# Patient Record
Sex: Male | Born: 1975 | Race: Black or African American | Hispanic: No | Marital: Single | Smoking: Current every day smoker
Health system: Southern US, Community
[De-identification: ages and names within clinical notes are randomized; demographics above are authoritative.]

## PROBLEM LIST (undated history)

## (undated) DIAGNOSIS — I10 Essential (primary) hypertension: Secondary | ICD-10-CM

## (undated) DIAGNOSIS — N433 Hydrocele, unspecified: Secondary | ICD-10-CM

## (undated) DIAGNOSIS — K219 Gastro-esophageal reflux disease without esophagitis: Secondary | ICD-10-CM

## (undated) DIAGNOSIS — R351 Nocturia: Secondary | ICD-10-CM

## (undated) HISTORY — DX: Essential (primary) hypertension: I10

---

## 1999-06-09 ENCOUNTER — Emergency Department (HOSPITAL_COMMUNITY): Admission: EM | Admit: 1999-06-09 | Discharge: 1999-06-09 | Payer: Self-pay | Admitting: *Deleted

## 2000-03-13 ENCOUNTER — Emergency Department (HOSPITAL_COMMUNITY): Admission: EM | Admit: 2000-03-13 | Discharge: 2000-03-14 | Payer: Self-pay | Admitting: Emergency Medicine

## 2000-03-29 ENCOUNTER — Emergency Department (HOSPITAL_COMMUNITY): Admission: EM | Admit: 2000-03-29 | Discharge: 2000-03-29 | Payer: Self-pay | Admitting: Emergency Medicine

## 2011-05-28 ENCOUNTER — Encounter: Payer: Self-pay | Admitting: *Deleted

## 2011-05-28 ENCOUNTER — Emergency Department (HOSPITAL_COMMUNITY)
Admission: EM | Admit: 2011-05-28 | Discharge: 2011-05-28 | Disposition: A | Discharge status: Home / Self Care | Payer: Self-pay | Attending: Emergency Medicine | Admitting: Emergency Medicine

## 2011-05-28 DIAGNOSIS — M79609 Pain in unspecified limb: Secondary | ICD-10-CM | POA: Insufficient documentation

## 2011-05-28 DIAGNOSIS — W540XXA Bitten by dog, initial encounter: Secondary | ICD-10-CM | POA: Insufficient documentation

## 2011-05-28 DIAGNOSIS — S81009A Unspecified open wound, unspecified knee, initial encounter: Secondary | ICD-10-CM | POA: Insufficient documentation

## 2011-05-28 DIAGNOSIS — Z203 Contact with and (suspected) exposure to rabies: Secondary | ICD-10-CM | POA: Insufficient documentation

## 2011-05-28 DIAGNOSIS — F17200 Nicotine dependence, unspecified, uncomplicated: Secondary | ICD-10-CM | POA: Insufficient documentation

## 2011-05-28 DIAGNOSIS — Z23 Encounter for immunization: Secondary | ICD-10-CM | POA: Insufficient documentation

## 2011-05-28 MED ORDER — AMOXICILLIN-POT CLAVULANATE 875-125 MG PO TABS
1.0000 | ORAL_TABLET | Freq: Once | ORAL | Status: AC
  Administered 2011-05-28: 1 via ORAL
  Filled 2011-05-28: qty 1

## 2011-05-28 MED ORDER — TETANUS-DIPHTHERIA TOXOIDS TD 5-2 LFU IM INJ
0.5000 mL | INJECTION | Freq: Once | INTRAMUSCULAR | Status: DC
  Filled 2011-05-28: qty 0.5

## 2011-05-28 MED ORDER — TETANUS-DIPHTH-ACELL PERTUSSIS 5-2.5-18.5 LF-MCG/0.5 IM SUSP
INTRAMUSCULAR | Status: AC
  Administered 2011-05-28: 0.5 mL via INTRAMUSCULAR
  Filled 2011-05-28: qty 0.5

## 2011-05-28 MED ORDER — AMOXICILLIN-POT CLAVULANATE 875-125 MG PO TABS: 1.0000 | ORAL_TABLET | Freq: Two times a day (BID) | ORAL | Status: AC

## 2011-05-28 MED ORDER — TETANUS-DIPHTH-ACELL PERTUSSIS 5-2.5-18.5 LF-MCG/0.5 IM SUSP: 0.5000 mL | Freq: Once | INTRAMUSCULAR | Status: DC

## 2011-05-28 MED ORDER — OXYCODONE-ACETAMINOPHEN 5-325 MG PO TABS
2.0000 | ORAL_TABLET | Freq: Once | ORAL | Status: AC
  Administered 2011-05-28: 2 via ORAL
  Filled 2011-05-28: qty 2

## 2011-05-28 MED ORDER — OXYCODONE-ACETAMINOPHEN 5-325 MG PO TABS: 1.0000 | ORAL_TABLET | Freq: Four times a day (QID) | ORAL | Status: AC | PRN

## 2011-05-28 MED ORDER — RABIES IMMUNE GLOBULIN 150 UNIT/ML IM INJ
20.0000 [IU]/kg | INJECTION | Freq: Once | INTRAMUSCULAR | Status: AC
  Administered 2011-05-28: 2400 [IU] via INTRAMUSCULAR
  Filled 2011-05-28: qty 16

## 2011-05-28 MED ORDER — RABIES VACCINE, PCEC IM SUSR
1.0000 mL | Freq: Once | INTRAMUSCULAR | Status: AC
  Administered 2011-05-28: 1 mL via INTRAMUSCULAR
  Filled 2011-05-28: qty 1

## 2011-05-28 NOTE — ED Provider Notes (Signed)
Medical screening examination/treatment/procedure(s) were performed by non-physician practitioner and as supervising physician I was immediately available for consultation/collaboration.  Genavieve Mangiapane T Jaclin Finks, MD 05/28/11 2339 

## 2011-05-28 NOTE — ED Provider Notes (Signed)
History     CSN: [id number]  Arrival date & time 05/28/11  1918   First MD Initiated Contact with Patient 05/28/11 2041      Chief Complaint  Patient presents with  . Animal Bite    (Consider location/radiation/quality/duration/timing/severity/associated sxs/prior treatment) HPI Comments: Patient states he was walking to the bus stop and several dogs came out from the bushes a.m. on his posterior left calf.  Now has several puncture wounds to the area.  Last tetanus shot unknown.  Patient does not know who the dog's along to nor could be identified.  The duct at this time  Patient is a 35 y.o. male presenting with animal bite. The history is provided by the patient.  Animal Bite  The incident occurred just prior to arrival. He came to the ER via personal transport. There is an injury to the left lower leg. The pain is mild. Pertinent negatives include no weakness. There have been no prior injuries to these areas. His tetanus status is out of date. He has been behaving normally.    History reviewed. No pertinent past medical history.  History reviewed. No pertinent past surgical history.  History reviewed. No pertinent family history.  History  Substance Use Topics  . Smoking status: Current Everyday Smoker  . Smokeless tobacco: Not on file  . Alcohol Use: Yes      Review of Systems  Neurological: Negative for dizziness and weakness.    Allergies  Review of patient's allergies indicates no known allergies.  Home Medications   Current Outpatient Rx  Name Route Sig Dispense Refill  . NAPROXEN SODIUM 220 MG PO TABS Oral Take 220 mg by mouth 2 (two) times daily with a meal. For pain relief     . TYLENOL COLD MULTI-SYMPTOM PO Oral Take 15 mLs by mouth every 6 (six) hours as needed. For cold and flu symptoms     . AMOXICILLIN-POT CLAVULANATE 875-125 MG PO TABS Oral Take 1 tablet by mouth 2 (two) times daily. 20 tablet 0  . OXYCODONE-ACETAMINOPHEN 5-325 MG PO TABS Oral  Take 1 tablet by mouth every 6 (six) hours as needed for pain. 30 tablet 0    BP 189/109  Pulse 80  Temp 98.8 F (37.1 C)  Resp 20  SpO2 100%  Physical Exam  Constitutional: He is oriented to person, place, and time. He appears well-developed.  HENT:  Head: Normocephalic.  Eyes: Pupils are equal, round, and reactive to light.  Neck: Normal range of motion.  Cardiovascular: Normal rate.   Pulmonary/Chest: Effort normal.  Musculoskeletal: Normal range of motion.  Neurological: He is oriented to person, place, and time.  Skin:       Laceration and puncture wound to back of L calf     ED Course  LACERATION REPAIR Date/Time: 05/28/2011 10:20 PM Performed by: Arman Filter Authorized by: Arman Filter Consent: Verbal consent obtained. Risks and benefits: risks, benefits and alternatives were discussed Consent given by: patient Patient identity confirmed: verbally with patient Body area: lower extremity Contamination: The wound is contaminated. Foreign bodies: unknown Tendon involvement: none Nerve involvement: none Vascular damage: no Patient sedated: no Irrigation solution: saline Irrigation method: syringe Amount of cleaning: extensive Debridement: none Degree of undermining: none Skin closure: Steri-Strips Approximation: loose Approximation difficulty: simple Dressing: 4x4 sterile gauze Patient tolerance: Patient tolerated the procedure well with no immediate complications. Comments: Steri-Strips applied, as this was a dog wound to allow for drainage from site.  Patient placed on  antibiotic   (including critical care time)  Labs Reviewed - No data to display No results found.   1. Bite from dog       MDM  Laceration and puncture wounds to , posterior left calf.  Will clean wounds thoroughly updated tetanus, start rabies series for patient on antibiotics        Arman Filter, NP 05/28/11 2144  Arman Filter, NP 05/28/11 2221  Arman Filter,  NP 05/28/11 2225

## 2011-05-28 NOTE — ED Notes (Signed)
Pt in c/o dog bite to back of left leg, bleeding controlled, pt did not know dogs, states they just jumped out when he was waiting for the bus

## 2011-05-29 ENCOUNTER — Encounter (HOSPITAL_COMMUNITY): Payer: Self-pay | Admitting: *Deleted

## 2011-05-29 ENCOUNTER — Emergency Department (HOSPITAL_COMMUNITY)
Admission: EM | Admit: 2011-05-29 | Discharge: 2011-05-30 | Disposition: A | Discharge status: Home / Self Care | Payer: Self-pay | Attending: Emergency Medicine | Admitting: Emergency Medicine

## 2011-05-29 DIAGNOSIS — T148XXA Other injury of unspecified body region, initial encounter: Secondary | ICD-10-CM

## 2011-05-29 DIAGNOSIS — M79609 Pain in unspecified limb: Secondary | ICD-10-CM | POA: Insufficient documentation

## 2011-05-29 DIAGNOSIS — F17200 Nicotine dependence, unspecified, uncomplicated: Secondary | ICD-10-CM | POA: Insufficient documentation

## 2011-05-29 DIAGNOSIS — W540XXA Bitten by dog, initial encounter: Secondary | ICD-10-CM | POA: Insufficient documentation

## 2011-05-29 DIAGNOSIS — S91009A Unspecified open wound, unspecified ankle, initial encounter: Secondary | ICD-10-CM | POA: Insufficient documentation

## 2011-05-29 DIAGNOSIS — S81009A Unspecified open wound, unspecified knee, initial encounter: Secondary | ICD-10-CM | POA: Insufficient documentation

## 2011-05-29 NOTE — ED Notes (Signed)
Pt was at [facility] x 1 day ago for a dog bite and instructed to come back tonight for a recheck.  Pt presents tonight for said recheck.

## 2011-05-30 NOTE — ED Provider Notes (Signed)
Medical screening examination/treatment/procedure(s) were performed by non-physician practitioner and as supervising physician I was immediately available for consultation/collaboration.   Vida Roller, MD 05/30/11 [phone]

## 2011-05-30 NOTE — ED Provider Notes (Signed)
History     CSN: [id number]  Arrival date & time 05/29/11  2142   First MD Initiated Contact with Patient 05/29/11 2355      Chief Complaint  Patient presents with  . Wound Check    (Consider location/radiation/quality/duration/timing/severity/associated sxs/prior treatment) HPI Comments: Mr. Maahs returns to have his dog bite recheck per my request  Patient is a 35 y.o. male presenting with wound check. The history is provided by the patient.  Wound Check  He was treated in the ED yesterday. Previous treatment in the ED includes oral antibiotics and wound cleansing or irrigation. Treatments since wound repair include a wound recheck.    History reviewed. No pertinent past medical history.  History reviewed. No pertinent past surgical history.  History reviewed. No pertinent family history.  History  Substance Use Topics  . Smoking status: Current Everyday Smoker  . Smokeless tobacco: Not on file  . Alcohol Use: Yes      Review of Systems  Constitutional: Negative.   Respiratory: Negative.   Gastrointestinal: Negative for nausea.  Musculoskeletal: Negative for myalgias and joint swelling.  Neurological: Negative.     Allergies  Review of patient's allergies indicates no known allergies.  Home Medications   Current Outpatient Rx  Name Route Sig Dispense Refill  . AMOXICILLIN-POT CLAVULANATE 875-125 MG PO TABS Oral Take 1 tablet by mouth 2 (two) times daily. 20 tablet 0  . NAPROXEN SODIUM 220 MG PO TABS Oral Take 220 mg by mouth 2 (two) times daily with a meal. For pain relief     . OXYCODONE-ACETAMINOPHEN 5-325 MG PO TABS Oral Take 1 tablet by mouth every 6 (six) hours as needed for pain. 30 tablet 0  . TYLENOL COLD MULTI-SYMPTOM PO Oral Take 15 mLs by mouth every 6 (six) hours as needed. For cold and flu symptoms       BP 162/103  Pulse 110  Temp(Src) 98.5 F (36.9 C) (Oral)  Resp 18  SpO2 98%  Physical Exam  Constitutional: He is oriented to  person, place, and time. He appears well-developed and well-nourished.  Neck: Normal range of motion.  Cardiovascular: Normal rate.   Pulmonary/Chest: Effort normal.  Musculoskeletal: Normal range of motion.  Neurological: He is oriented to person, place, and time.  Skin: Skin is warm.       Dog bite, posterior right calf shows no sign of infection.  Wounds open, but not draining minimal pain, again, cleaned with Betadine and redressed    ED Course  Procedures (including critical care time)  Labs Reviewed - No data to display No results found.   1. Animal bite with open wound       MDM  Recheck of dog bites left open        Arman Filter, NP 05/30/11 0009

## 2011-05-31 ENCOUNTER — Encounter (HOSPITAL_COMMUNITY): Payer: Self-pay | Admitting: *Deleted

## 2011-05-31 ENCOUNTER — Emergency Department (HOSPITAL_COMMUNITY)
Admission: EM | Admit: 2011-05-31 | Discharge: 2011-05-31 | Disposition: A | Discharge status: Home / Self Care | Payer: Self-pay | Source: Home / Self Care

## 2011-05-31 MED ORDER — RABIES VACCINE, PCEC IM SUSR
1.0000 mL | Freq: Once | INTRAMUSCULAR | Status: AC
  Administered 2011-05-31: 1 mL via INTRAMUSCULAR

## 2011-05-31 MED ORDER — RABIES VACCINE, PCEC IM SUSR
INTRAMUSCULAR | Status: AC
  Filled 2011-05-31: qty 1

## 2011-05-31 NOTE — ED Notes (Signed)
Wound cleansed with Surcleans and redressed with sterile 4x4, 3 " cling and tape.

## 2011-05-31 NOTE — ED Notes (Signed)
Stray dog bite to post. L lower leg.  2 Wounds appear to be healing.  No signs of infection. Pt. here for 2nd rabies vaccine.

## 2011-06-04 ENCOUNTER — Encounter (HOSPITAL_COMMUNITY): Payer: Self-pay | Admitting: *Deleted

## 2011-06-04 ENCOUNTER — Emergency Department (HOSPITAL_COMMUNITY)
Admission: EM | Admit: 2011-06-04 | Discharge: 2011-06-04 | Disposition: A | Discharge status: Home / Self Care | Payer: Self-pay | Source: Home / Self Care

## 2011-06-04 MED ORDER — RABIES VACCINE, PCEC IM SUSR
1.0000 mL | Freq: Once | INTRAMUSCULAR | Status: AC
  Administered 2011-06-04: 1 mL via INTRAMUSCULAR

## 2011-06-04 MED ORDER — RABIES VACCINE, PCEC IM SUSR
INTRAMUSCULAR | Status: AC
  Filled 2011-06-04: qty 1

## 2011-06-04 NOTE — ED Notes (Signed)
Presents for rabies injection.  Denies c/o's.

## 2011-06-11 ENCOUNTER — Emergency Department (INDEPENDENT_AMBULATORY_CARE_PROVIDER_SITE_OTHER)
Admission: EM | Admit: 2011-06-11 | Discharge: 2011-06-11 | Disposition: A | Discharge status: Home / Self Care | Payer: Self-pay | Source: Home / Self Care

## 2011-06-11 ENCOUNTER — Encounter (HOSPITAL_COMMUNITY): Payer: Self-pay | Admitting: *Deleted

## 2011-06-11 DIAGNOSIS — Z23 Encounter for immunization: Secondary | ICD-10-CM

## 2011-06-11 MED ORDER — RABIES VACCINE, PCEC IM SUSR
INTRAMUSCULAR | Status: AC
  Filled 2011-06-11: qty 1

## 2011-06-11 MED ORDER — RABIES VACCINE, PCEC IM SUSR
1.0000 mL | Freq: Once | INTRAMUSCULAR | Status: AC
  Administered 2011-06-11: 1 mL via INTRAMUSCULAR

## 2011-06-11 NOTE — ED Notes (Signed)
Here for last rabies vaccine for dog bite to L calf with 2 lacerations.  States it healing but states is draining a little.

## 2012-12-04 ENCOUNTER — Other Ambulatory Visit: Payer: Self-pay | Admitting: Urology

## 2012-12-26 ENCOUNTER — Encounter (HOSPITAL_BASED_OUTPATIENT_CLINIC_OR_DEPARTMENT_OTHER): Payer: Self-pay | Admitting: *Deleted

## 2012-12-26 NOTE — Progress Notes (Signed)
NPO AFTER MN. ARRIVES AT 0945. NEEDS ISTAT AND EKG. WILL TAKE NORVASC AM OF SURG W/ SIP OF WATER.

## 2013-01-01 NOTE — H&P (Signed)
History of Present Illness   Mr. Basaldua has an ongoing very large hydrocele. There has been no changes. He has had scrotal pain. His intermittent slow flow is stable. He had a scrotal ultrasound and was here for reevaluation. Right testicle was 5.42 cm x 2.46 cm x 4.89 cm. Left testicle was 4.94 cm x 4.03 cm x 5.1 cm. He had a large right hydrocele 14 cm x 12 cm x 14 cm. He had a small hydrocele on the left with the blood flow bilaterally. Echogenicity of testicles were normal. I did not see any intratesticular abnormality. There was minimal fluid around the left testicle. I did not see any evidence of a hernia.   Review of Systems: No change in bowel or neurologic systems.   Urinalysis: I reviewed, negative.    Past Medical History Problems  1. History of  Esophageal Reflux 530.81 2. History of  Hypertension 401.9  Surgical History Problems  1. History of  No Surgical Problems  Current Meds 1. Ibuprofen TABS; Therapy: (Recorded:29May2014) to  Allergies Medication  1. No Known Drug Allergies  Family History Problems  1. Family history of  Family Health Status Number Of Children 4 sons 2. Family history of  Family Health Status Of Father - Alive 3. Family history of  Family Health Status Of Mother - Alive  Social History Problems  1. Alcohol Use 2 per week 2. Caffeine Use 1 per day 3. Current Smoker 305.1 smokes 1/2 ppd 4. Marital History - Single 5. Occupation: Lawn care  Vitals Vital Signs [Data Includes: Last 1 Day]  12Jun2014 02:42PM  Blood Pressure: 193 / 126 Temperature: 98.9 F Heart Rate: 89  Results/Data  Urine [Data Includes: Last 1 Day]   12Jun2014  COLOR AMBER   APPEARANCE CLEAR   SPECIFIC GRAVITY >1.030   pH 5.5   GLUCOSE 500 mg/dL  BILIRUBIN SMALL   KETONE NEG mg/dL  BLOOD NEG   PROTEIN 30 mg/dL  UROBILINOGEN 0.2 mg/dL  NITRITE NEG   LEUKOCYTE ESTERASE NEG   SQUAMOUS EPITHELIAL/HPF NONE SEEN   WBC 3-6 WBC/hpf  RBC 0-2 RBC/hpf  BACTERIA  RARE   CRYSTALS NONE SEEN   CASTS Hyaline casts noted    Assessment Assessed  1. 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 Nocturia 788.43  Plan   Discussion/Summary   Mr. Sacra and I spoke about a right hydrocele repair. If the left side needed to be done, I would also do this. This is doubtful. Pros and cons and risks of surgery were discussed in detail. Some of the risks discussed were previous trauma, bleeding requiring evacuation, chronic swelling, infection and sequelae, intraoperative finding of hernia and sequelae, and ongoing pain. Based upon the size of the hydrocele, it may be uncomfortable, but he knows it really should not be causing a lot of pain in general.   Delice Bison pointed out that Mr. Thatch's blood pressure has been 193/126. It was elevated last time as well. We are going to get a medical clearance and have him see primary care for his blood pressure before proceeding with surgery. We will await the clearance.   Mr. Todt asked for pain medicine. I gave him 40 Vicodin. I educated him that I will not be giving him another Vicodin prescription preoperatively. I educated him that within weeks after surgery, he would not get chronic pain medicine from me.   Again I made it clear to Mr. Camper that he will not be getting chronic pain medicine if he  has chronic scrotal pain. I think his partner may have also been involved with asking for them. He said that he understood my directives.  After a thorough review of the management options for the patient's condition the patient  elected to proceed with surgical therapy as noted above. We have discussed the potential benefits and risks of the procedure, side effects of the proposed treatment, the likelihood of the patient achieving the goals of the procedure, and any potential problems that might occur during the procedure or recuperation. Informed consent has been obtained.

## 2013-01-02 ENCOUNTER — Encounter (HOSPITAL_BASED_OUTPATIENT_CLINIC_OR_DEPARTMENT_OTHER)
Admission: RE | Disposition: A | Discharge status: Home / Self Care | Payer: Self-pay | Source: Ambulatory Visit | Attending: Urology

## 2013-01-02 ENCOUNTER — Encounter (HOSPITAL_BASED_OUTPATIENT_CLINIC_OR_DEPARTMENT_OTHER): Payer: Self-pay

## 2013-01-02 ENCOUNTER — Other Ambulatory Visit: Payer: Self-pay

## 2013-01-02 ENCOUNTER — Ambulatory Visit (HOSPITAL_BASED_OUTPATIENT_CLINIC_OR_DEPARTMENT_OTHER)
Admission: RE | Admit: 2013-01-02 | Discharge: 2013-01-02 | Disposition: A | Discharge status: Home / Self Care | Payer: BC Managed Care – PPO | Source: Ambulatory Visit | Attending: Urology | Admitting: Urology

## 2013-01-02 ENCOUNTER — Ambulatory Visit (HOSPITAL_BASED_OUTPATIENT_CLINIC_OR_DEPARTMENT_OTHER): Payer: BC Managed Care – PPO | Admitting: Anesthesiology

## 2013-01-02 ENCOUNTER — Encounter (HOSPITAL_BASED_OUTPATIENT_CLINIC_OR_DEPARTMENT_OTHER): Payer: Self-pay | Admitting: Anesthesiology

## 2013-01-02 DIAGNOSIS — I10 Essential (primary) hypertension: Secondary | ICD-10-CM | POA: Insufficient documentation

## 2013-01-02 DIAGNOSIS — K219 Gastro-esophageal reflux disease without esophagitis: Secondary | ICD-10-CM | POA: Insufficient documentation

## 2013-01-02 DIAGNOSIS — F17200 Nicotine dependence, unspecified, uncomplicated: Secondary | ICD-10-CM | POA: Insufficient documentation

## 2013-01-02 DIAGNOSIS — N433 Hydrocele, unspecified: Secondary | ICD-10-CM | POA: Insufficient documentation

## 2013-01-02 DIAGNOSIS — R351 Nocturia: Secondary | ICD-10-CM | POA: Insufficient documentation

## 2013-01-02 HISTORY — DX: Nocturia: R35.1

## 2013-01-02 HISTORY — DX: Hydrocele, unspecified: N43.3

## 2013-01-02 HISTORY — DX: Gastro-esophageal reflux disease without esophagitis: K21.9

## 2013-01-02 HISTORY — PX: HYDROCELE EXCISION: SHX482

## 2013-01-02 LAB — POCT I-STAT 4, (NA,K, GLUC, HGB,HCT)
Glucose, Bld: 118 mg/dL — ABNORMAL HIGH (ref 70–99)
Hemoglobin: 15.3 g/dL (ref 13.0–17.0)
Potassium: 3.3 mEq/L — ABNORMAL LOW (ref 3.5–5.1)

## 2013-01-02 SURGERY — HYDROCELECTOMY
Anesthesia: General | Site: Scrotum | Laterality: Right | Wound class: Clean

## 2013-01-02 MED ORDER — PROMETHAZINE HCL 25 MG/ML IJ SOLN
6.2500 mg | INTRAMUSCULAR | Status: DC | PRN
  Filled 2013-01-02: qty 1

## 2013-01-02 MED ORDER — HYDROCODONE-ACETAMINOPHEN 5-325 MG PO TABS: 1.0000 | ORAL_TABLET | Freq: Four times a day (QID) | ORAL | Status: DC | PRN

## 2013-01-02 MED ORDER — CEFAZOLIN SODIUM 1-5 GM-% IV SOLN
1.0000 g | INTRAVENOUS | Status: DC
  Filled 2013-01-02: qty 50

## 2013-01-02 MED ORDER — DEXAMETHASONE SODIUM PHOSPHATE 4 MG/ML IJ SOLN
INTRAMUSCULAR | Status: DC | PRN
  Administered 2013-01-02: 10 mg via INTRAVENOUS

## 2013-01-02 MED ORDER — CEFAZOLIN SODIUM-DEXTROSE 2-3 GM-% IV SOLR
INTRAVENOUS | Status: DC | PRN
  Administered 2013-01-02: 2 g via INTRAVENOUS

## 2013-01-02 MED ORDER — LACTATED RINGERS IV SOLN
INTRAVENOUS | Status: DC
  Administered 2013-01-02: 10:00:00 via INTRAVENOUS
  Filled 2013-01-02: qty 1000

## 2013-01-02 MED ORDER — CEFAZOLIN SODIUM-DEXTROSE 2-3 GM-% IV SOLR
2.0000 g | INTRAVENOUS | Status: DC
  Filled 2013-01-02: qty 50

## 2013-01-02 MED ORDER — LACTATED RINGERS IV SOLN
INTRAVENOUS | Status: DC | PRN
  Administered 2013-01-02 (×3): via INTRAVENOUS

## 2013-01-02 MED ORDER — ONDANSETRON HCL 4 MG/2ML IJ SOLN
INTRAMUSCULAR | Status: DC | PRN
  Administered 2013-01-02: 4 mg via INTRAVENOUS

## 2013-01-02 MED ORDER — MIDAZOLAM HCL 5 MG/5ML IJ SOLN
INTRAMUSCULAR | Status: DC | PRN
  Administered 2013-01-02: 1 mg via INTRAVENOUS
  Administered 2013-01-02: 2 mg via INTRAVENOUS
  Administered 2013-01-02: 1 mg via INTRAVENOUS
  Administered 2013-01-02: 2 mg via INTRAVENOUS

## 2013-01-02 MED ORDER — LACTATED RINGERS IV SOLN
INTRAVENOUS | Status: DC
  Filled 2013-01-02: qty 1000

## 2013-01-02 MED ORDER — CEPHALEXIN 250 MG PO CAPS: 250.0000 mg | ORAL_CAPSULE | Freq: Three times a day (TID) | ORAL | Status: DC

## 2013-01-02 MED ORDER — HYDROCODONE-ACETAMINOPHEN 5-325 MG PO TABS
1.0000 | ORAL_TABLET | Freq: Four times a day (QID) | ORAL | Status: AC | PRN
  Administered 2013-01-02: 1 via ORAL
  Filled 2013-01-02: qty 1

## 2013-01-02 MED ORDER — LIDOCAINE HCL (CARDIAC) 20 MG/ML IV SOLN
INTRAVENOUS | Status: DC | PRN
  Administered 2013-01-02: 100 mg via INTRAVENOUS

## 2013-01-02 MED ORDER — PROPOFOL 10 MG/ML IV BOLUS
INTRAVENOUS | Status: DC | PRN
  Administered 2013-01-02: 250 mg via INTRAVENOUS
  Administered 2013-01-02: 50 mg via INTRAVENOUS
  Administered 2013-01-02 (×2): 10 mg via INTRAVENOUS

## 2013-01-02 MED ORDER — 0.9 % SODIUM CHLORIDE (POUR BTL) OPTIME
TOPICAL | Status: DC | PRN
  Administered 2013-01-02: 1000 mL

## 2013-01-02 MED ORDER — HYDROMORPHONE HCL PF 1 MG/ML IJ SOLN
0.2500 mg | INTRAMUSCULAR | Status: DC | PRN
  Administered 2013-01-02: 0.25 mg via INTRAVENOUS
  Filled 2013-01-02: qty 1

## 2013-01-02 MED ORDER — KETOROLAC TROMETHAMINE 30 MG/ML IJ SOLN
INTRAMUSCULAR | Status: DC | PRN
  Administered 2013-01-02: 30 mg via INTRAVENOUS

## 2013-01-02 MED ORDER — FENTANYL CITRATE 0.05 MG/ML IJ SOLN
INTRAMUSCULAR | Status: DC | PRN
  Administered 2013-01-02 (×5): 25 ug via INTRAVENOUS
  Administered 2013-01-02 (×2): 50 ug via INTRAVENOUS
  Administered 2013-01-02 (×3): 25 ug via INTRAVENOUS

## 2013-01-02 MED ORDER — GLYCOPYRROLATE 0.2 MG/ML IJ SOLN
INTRAMUSCULAR | Status: DC | PRN
  Administered 2013-01-02: 0.2 mg via INTRAVENOUS

## 2013-01-02 SURGICAL SUPPLY — 41 items
APPLICATOR COTTON TIP 6IN STRL (MISCELLANEOUS) ×2 IMPLANT
BANDAGE GAUZE ELAST BULKY 4 IN (GAUZE/BANDAGES/DRESSINGS) ×3 IMPLANT
BLADE SURG 15 STRL LF DISP TIS (BLADE) ×1 IMPLANT
BLADE SURG 15 STRL SS (BLADE) ×2
BLADE SURG ROTATE 9660 (MISCELLANEOUS) ×2 IMPLANT
CANISTER SUCTION 1200CC (MISCELLANEOUS) IMPLANT
CANISTER SUCTION 2500CC (MISCELLANEOUS) IMPLANT
CLOTH BEACON ORANGE TIMEOUT ST (SAFETY) ×2 IMPLANT
COVER MAYO STAND STRL (DRAPES) ×2 IMPLANT
COVER TABLE BACK 60X90 (DRAPES) ×2 IMPLANT
DISSECTOR ROUND CHERRY 3/8 STR (MISCELLANEOUS) IMPLANT
DRAIN PENROSE 18X1/4 LTX STRL (WOUND CARE) IMPLANT
DRAPE PED LAPAROTOMY (DRAPES) ×2 IMPLANT
DRESSING TELFA 8X3 (GAUZE/BANDAGES/DRESSINGS) ×1 IMPLANT
DRSG PAD ABDOMINAL 8X10 ST (GAUZE/BANDAGES/DRESSINGS) ×2 IMPLANT
ELECT NDL TIP 2.8 STRL (NEEDLE) ×1 IMPLANT
ELECT NEEDLE TIP 2.8 STRL (NEEDLE) ×2 IMPLANT
ELECT REM PT RETURN 9FT ADLT (ELECTROSURGICAL) ×2
ELECTRODE REM PT RTRN 9FT ADLT (ELECTROSURGICAL) ×1 IMPLANT
GAUZE SPONGE 4X4 12PLY STRL LF (GAUZE/BANDAGES/DRESSINGS) ×2 IMPLANT
GLOVE BIO SURGEON STRL SZ 6 (GLOVE) ×1 IMPLANT
GLOVE BIO SURGEON STRL SZ 6.5 (GLOVE) ×2 IMPLANT
GLOVE BIO SURGEON STRL SZ7.5 (GLOVE) ×2 IMPLANT
GLOVE INDICATOR 6.5 STRL GRN (GLOVE) ×1 IMPLANT
GOWN W/COTTON TOWEL STD LRG (GOWNS) ×2 IMPLANT
GOWN XL W/COTTON TOWEL STD (GOWNS) ×2 IMPLANT
NDL HYPO 25X1 1.5 SAFETY (NEEDLE) IMPLANT
NEEDLE HYPO 25X1 1.5 SAFETY (NEEDLE) IMPLANT
NS IRRIG 500ML POUR BTL (IV SOLUTION) ×2 IMPLANT
PACK BASIN DAY SURGERY FS (CUSTOM PROCEDURE TRAY) ×2 IMPLANT
PAD PREP 24X48 CUFFED NSTRL (MISCELLANEOUS) ×2 IMPLANT
PENCIL BUTTON HOLSTER BLD 10FT (ELECTRODE) ×2 IMPLANT
SUPPORT SCROTAL LG STRP (MISCELLANEOUS) ×3 IMPLANT
SUT CHROMIC 3 0 SH 27 (SUTURE) ×10 IMPLANT
SUT MNCRL AB 3-0 PS2 18 (SUTURE) ×2 IMPLANT
SUT SILK 2 0 SH (SUTURE) IMPLANT
SUT VICRYL 4-0 PS2 18IN ABS (SUTURE) ×2 IMPLANT
SYR CONTROL 10ML LL (SYRINGE) IMPLANT
TRAY DSU PREP LF (CUSTOM PROCEDURE TRAY) ×2 IMPLANT
WATER STERILE IRR 500ML POUR (IV SOLUTION) ×2 IMPLANT
YANKAUER SUCT BULB TIP NO VENT (SUCTIONS) ×2 IMPLANT

## 2013-01-02 NOTE — Transfer of Care (Deleted)
Immediate Anesthesia Transfer of Care Note  Patient: Joseph Dyer  Procedure(s) Performed: Procedure(s) (LRB): HYDROCELECTOMY ADULT (Right)  Patient Location: PACU  Anesthesia Type: General  Level of Consciousness: awake, sedated, patient cooperative and responds to stimulation  Airway & Oxygen Therapy: Patient Spontanous Breathing and Patient connected to face mask oxygen  As well as RA with stable SaO2.   Post-op Assessment: Report given to PACU RN, Post -op Vital signs reviewed and stable and Patient moving all extremities  Post vital signs: Reviewed and stable  Complications: No apparent anesthesia complications

## 2013-01-02 NOTE — Op Note (Signed)
Preoperative diagnosis: Large right hydrocele Postoperative diagnosis: Large right hydrocele Surgery: Right hydrocelectomy and right orchiopexy Surgeon: Dr. Lorin Picket Tequan Redmon  The patient has the above diagnoses and consented the above procedure. He had a head sized hydrocele that was quite enormous especially for young man. He was prepped and draped in the usual fashion in the supine position and given preoperative antibiotics  Somewhat due to the size a hydrocele he had a concealed penis. I did a midline incision approximately 8 or 9 cm in length staying away from the base of the penis. I dissected sharply and with cutting cautery for bleeders down to the capsule of the hydrocele. When I was in the appropriate plane I did dissection and mobilized the hydrocele from the overlying dartos fascia. Multiple blood vessels were fulgurated. I had a lengthy incision approximately 2 cm inferiorly to deliver the large hydrocele  I opened the hydrocele quite anterior and inferior approximately 2 cm and evacuated 800 cc of yellow fluid. The thickened sac was then opened anteriorly away from the testicle. The tissues were thick but the testicle looked normal. The cord was normal. I opened the sac widely. I felt because of the size of the sac and the thickening of the tissue I excised approximately 3/4 of an inch of it on both sides. I did a lot of fulgurating of the edges of the hydrocele sac. Appropriate fulguration of any scrotal bleeders was also performed  I then folds sac back upon itself and closed with interrupted 3-0 chromic not under tension and was loose around the cord cephalad.  The testicle and the repair was easily replaced in the right hemiscrotum. There was such and overstretching of the scrotum and scrotal tissues I thought it was best to do an orchiopexy at 6:00 and 3:00 in the midline with 3-0 monofilament suture. I did not want the cord and testicle the twist in situ. The suture was placed just  to the tunica albuginea.  I was very happy with the orientation of the testicle and hemostasis. Between 2 Babcocks at both ends I closed the dartos with running 3-0 chromic and then did interrupted 3-0 chromic for the skin. Pressure dressing was applied with mesh pants  From a cosmetic standpoint the scrotum looked dramatically different and one could see the shaft of the penis readily. Hopefully the operation will reach the patient's treatment goal. I did not feel that the patient needed a drain

## 2013-01-02 NOTE — Interval H&P Note (Signed)
History and Physical Interval Note:  01/02/2013 9:16 AM  Joseph Dyer  has presented today for surgery, with the diagnosis of RIGHT HYDROCELE  The various methods of treatment have been discussed with the patient and family. After consideration of risks, benefits and other options for treatment, the patient has consented to  Procedure(s): HYDROCELECTOMY ADULT (Right) as a surgical intervention .  The patient's history has been reviewed, patient examined, no change in status, stable for surgery.  I have reviewed the patient's chart and labs.  Questions were answered to the patient's satisfaction.     Taher Vannote A

## 2013-01-02 NOTE — Anesthesia Procedure Notes (Signed)
Procedure Name: LMA Insertion Date/Time: 01/02/2013 11:53 AM Performed by: Jessica Priest Pre-anesthesia Checklist: Patient identified, Emergency Drugs available, Suction available and Patient being monitored Patient Re-evaluated:Patient Re-evaluated prior to inductionOxygen Delivery Method: Circle System Utilized Preoxygenation: Pre-oxygenation with 100% oxygen Intubation Type: IV induction Ventilation: Mask ventilation without difficulty LMA: LMA inserted LMA Size: 5.0 Number of attempts: 1 Airway Equipment and Method: bite block Placement Confirmation: positive ETCO2 Tube secured with: Tape Dental Injury: Teeth and Oropharynx as per pre-operative assessment

## 2013-01-02 NOTE — Anesthesia Preprocedure Evaluation (Signed)
Anesthesia Evaluation  Patient identified by MRN, date of birth, ID band Patient awake    Reviewed: Allergy & Precautions, H&P , NPO status , Patient's Chart, lab work & pertinent test results  Airway Mallampati: II TM Distance: >3 FB Neck ROM: Full    Dental  (+) Teeth Intact and Dental Advisory Given   Pulmonary neg pulmonary ROS, Current Smoker,  breath sounds clear to auscultation  Pulmonary exam normal       Cardiovascular negative cardio ROS  Rhythm:Regular Rate:Normal     Neuro/Psych negative neurological ROS  negative psych ROS   GI/Hepatic Neg liver ROS, GERD-  Controlled,  Endo/Other  negative endocrine ROS  Renal/GU negative Renal ROS  negative genitourinary   Musculoskeletal negative musculoskeletal ROS (+)   Abdominal   Peds  Hematology negative hematology ROS (+)   Anesthesia Other Findings   Reproductive/Obstetrics                           Anesthesia Physical Anesthesia Plan  ASA: II  Anesthesia Plan: General   Post-op Pain Management:    Induction: Intravenous  Airway Management Planned: LMA  Additional Equipment:   Intra-op Plan:   Post-operative Plan: Extubation in OR  Informed Consent: I have reviewed the patients History and Physical, chart, labs and discussed the procedure including the risks, benefits and alternatives for the proposed anesthesia with the patient or authorized representative who has indicated his/her understanding and acceptance.   Dental advisory given  Plan Discussed with: CRNA  Anesthesia Plan Comments:         Anesthesia Quick Evaluation

## 2013-01-02 NOTE — Anesthesia Postprocedure Evaluation (Signed)
  Anesthesia Post-op Note  Patient: Joseph Dyer  Procedure(s) Performed: Procedure(s) (LRB): HYDROCELECTOMY ADULT (Right)  Patient Location: PACU  Anesthesia Type: General  Level of Consciousness: awake and alert   Airway and Oxygen Therapy: Patient Spontanous Breathing  Post-op Pain: mild  Post-op Assessment: Post-op Vital signs reviewed, Patient's Cardiovascular Status Stable, Respiratory Function Stable, Patent Airway and No signs of Nausea or vomiting  Last Vitals:  Filed Vitals:   01/02/13 1513  BP: 134/78  Pulse: 80  Temp:   Resp: 18    Post-op Vital Signs: stable   Complications: No apparent anesthesia complications. Some emergence delirium which improved with time, versed, propofol, and urination.

## 2013-01-03 ENCOUNTER — Encounter (HOSPITAL_BASED_OUTPATIENT_CLINIC_OR_DEPARTMENT_OTHER): Payer: Self-pay | Admitting: Urology

## 2013-01-15 NOTE — Transfer of Care (Signed)
Immediate Anesthesia Transfer of Care Note  Patient: Joseph Dyer  Procedure(s) Performed: Procedure(s) (LRB): HYDROCELECTOMY ADULT (Right)  Patient Location: PACU  Anesthesia Type: General  Level of Consciousness: awake, sedated, patient cooperative and responds to stimulation  Airway & Oxygen Therapy: Patient Spontanous Breathing and Patient connected to face mask oxygen  Post-op Assessment: Report given to PACU RN, Post -op Vital signs reviewed and stable and Patient moving all extremities  Post vital signs: Reviewed and stable  Complications: No apparent anesthesia complications- however remained in OR with assistance and MDA and surgeon for safety protocol transferred to PACU when safety measures WNL

## 2020-01-28 ENCOUNTER — Emergency Department (HOSPITAL_BASED_OUTPATIENT_CLINIC_OR_DEPARTMENT_OTHER)
Admission: EM | Admit: 2020-01-28 | Discharge: 2020-01-28 | Disposition: A | Discharge status: Home / Self Care | Payer: BC Managed Care – PPO | Attending: Emergency Medicine | Admitting: Emergency Medicine

## 2020-01-28 ENCOUNTER — Encounter (HOSPITAL_BASED_OUTPATIENT_CLINIC_OR_DEPARTMENT_OTHER): Payer: Self-pay | Admitting: Emergency Medicine

## 2020-01-28 ENCOUNTER — Other Ambulatory Visit: Payer: Self-pay

## 2020-01-28 ENCOUNTER — Emergency Department (HOSPITAL_BASED_OUTPATIENT_CLINIC_OR_DEPARTMENT_OTHER): Payer: BC Managed Care – PPO

## 2020-01-28 DIAGNOSIS — I10 Essential (primary) hypertension: Secondary | ICD-10-CM | POA: Diagnosis not present

## 2020-01-28 DIAGNOSIS — U071 COVID-19: Secondary | ICD-10-CM | POA: Diagnosis not present

## 2020-01-28 DIAGNOSIS — Z79899 Other long term (current) drug therapy: Secondary | ICD-10-CM | POA: Diagnosis not present

## 2020-01-28 DIAGNOSIS — F17210 Nicotine dependence, cigarettes, uncomplicated: Secondary | ICD-10-CM | POA: Insufficient documentation

## 2020-01-28 DIAGNOSIS — R05 Cough: Secondary | ICD-10-CM | POA: Diagnosis present

## 2020-01-28 LAB — SARS CORONAVIRUS 2 BY RT PCR (HOSPITAL ORDER, PERFORMED IN ~~LOC~~ HOSPITAL LAB): SARS Coronavirus 2: POSITIVE — AB

## 2020-01-28 IMAGING — CR DG CHEST 2V
2 series · 2 of 2 positions shown · non-contrast
Comparison: None.

CLINICAL DATA: Cough for 3 days, shortness of breath

EXAM:
CHEST - 2 VIEW

[w chest pa]
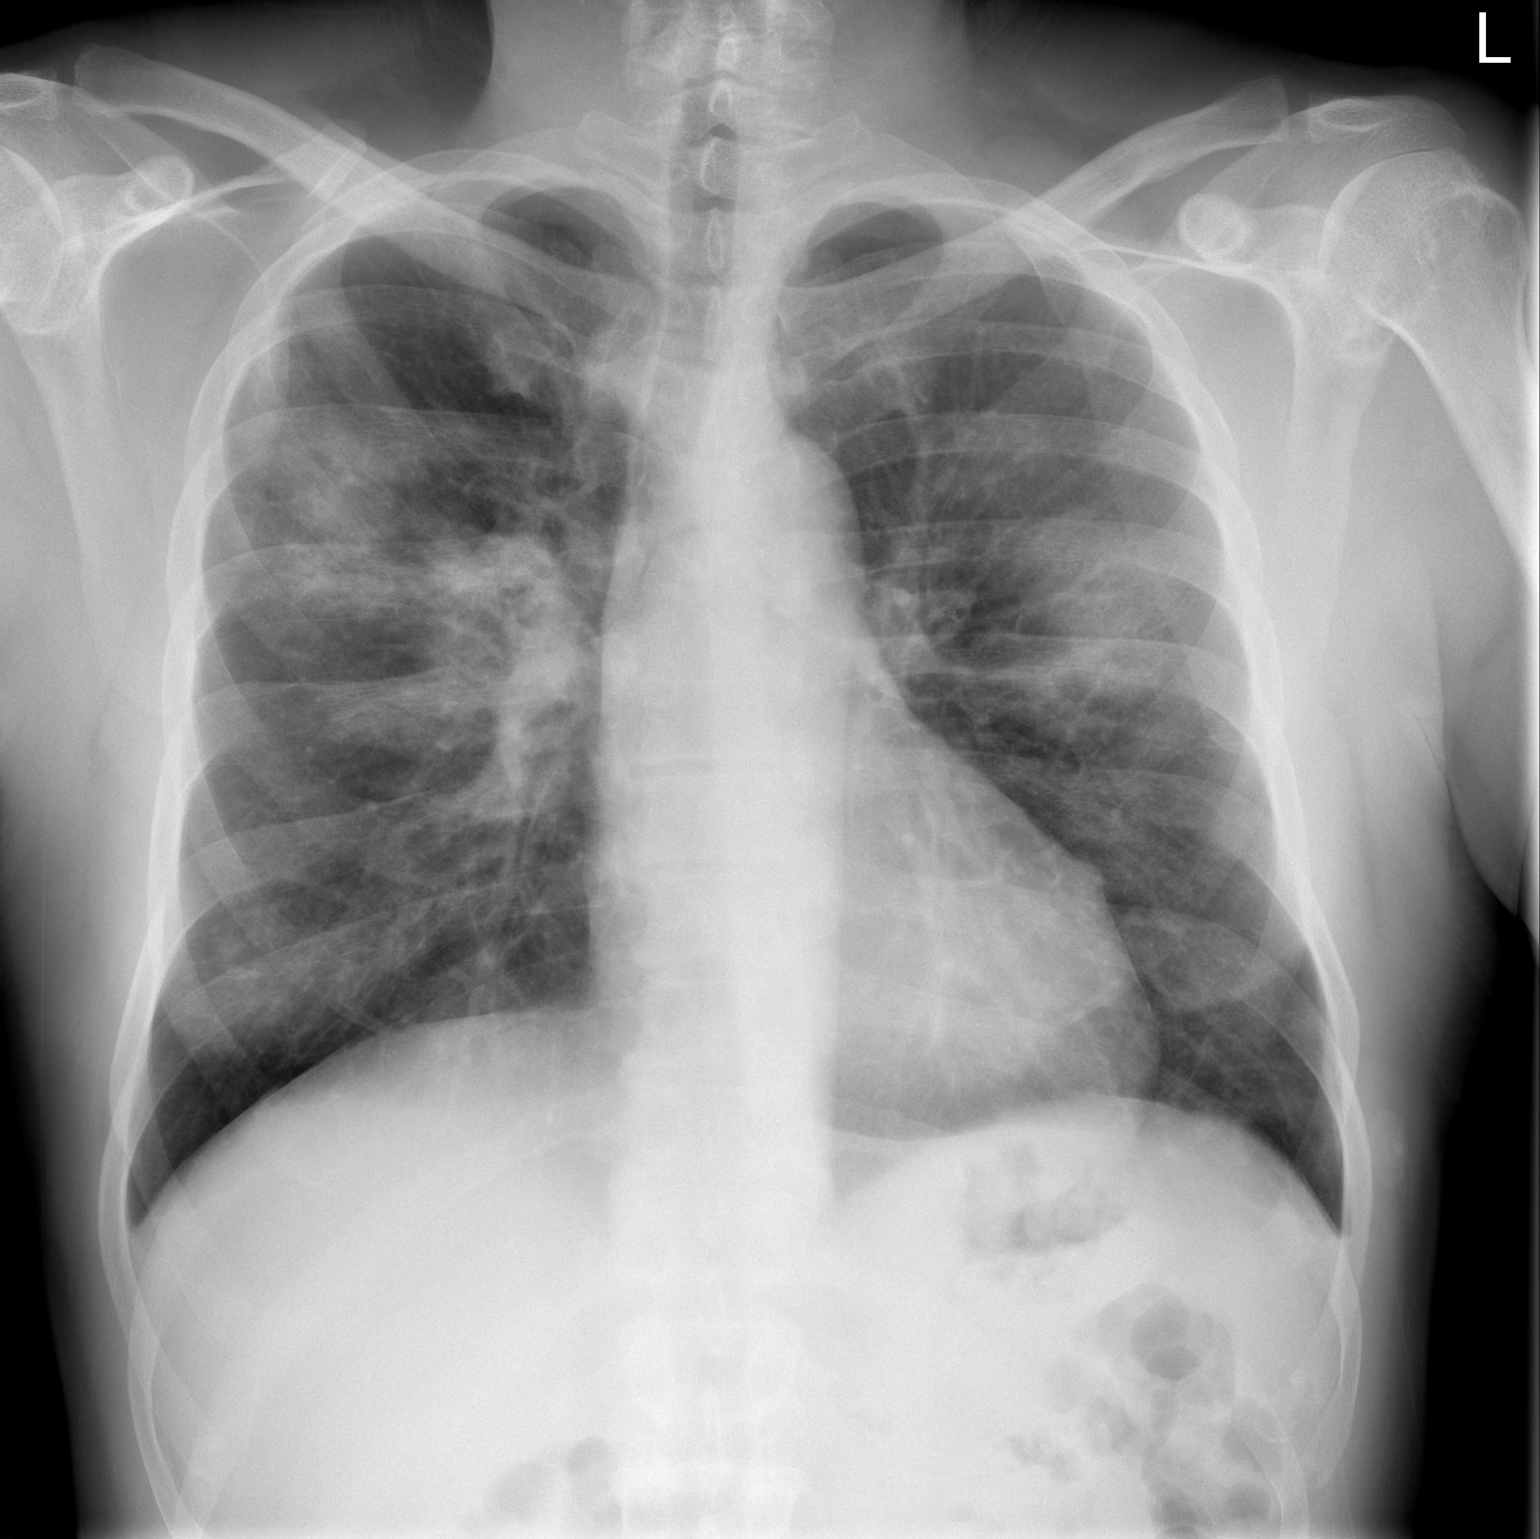

[w chest lat]
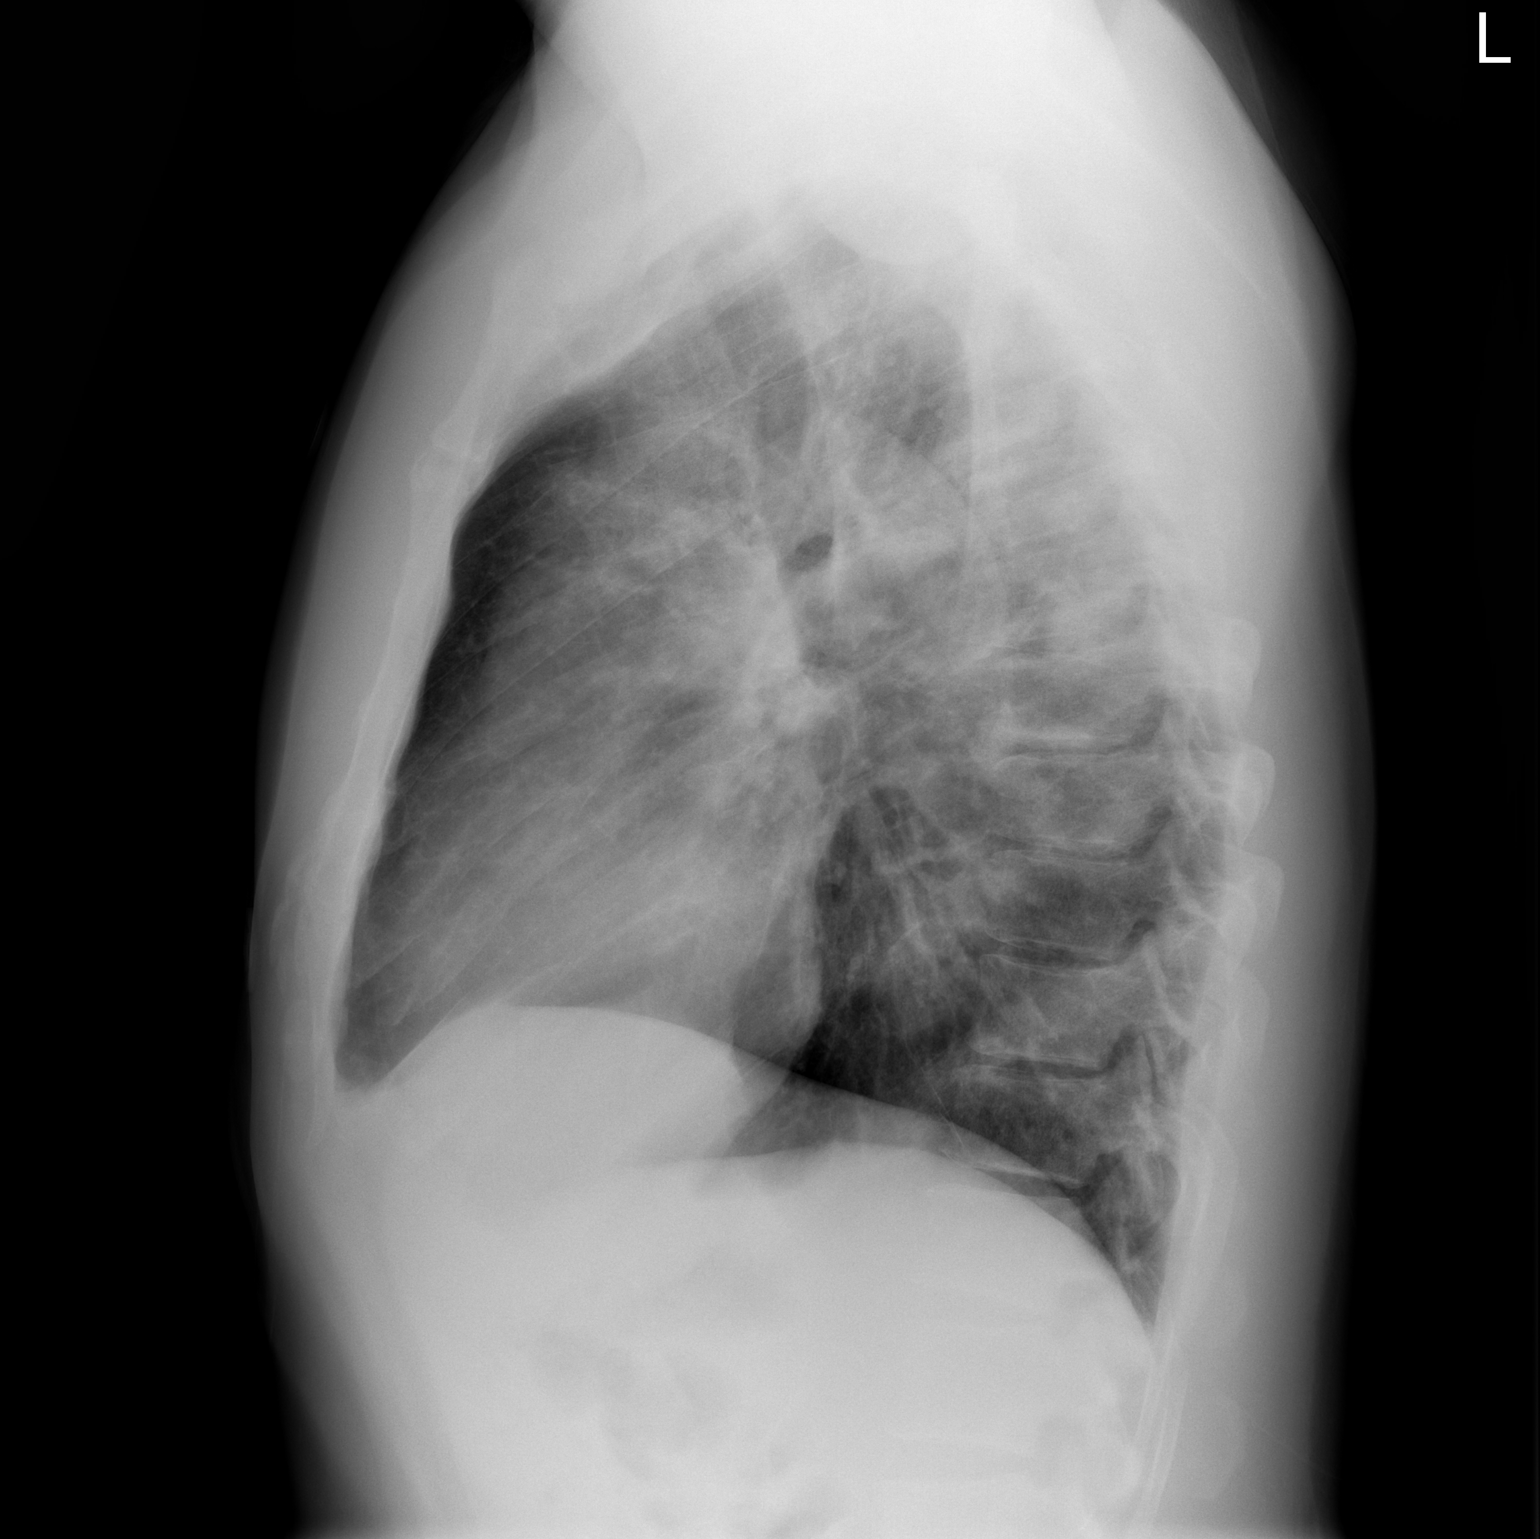

[2 of 2 positions shown; findings below may reference images not displayed]

FINDINGS: The heart size and mediastinal contours are within normal limits.
Patchy bilateral airspace opacities most pronounced within the upper
lobes. No pleural effusion. No pneumothorax. The visualized skeletal
structures are unremarkable.
IMPRESSION: Patchy bilateral airspace opacities most pronounced within the upper
lobes, suspicious for multifocal pneumonia.

## 2020-01-28 MED ORDER — ACETAMINOPHEN 500 MG PO TABS
1000.0000 mg | ORAL_TABLET | Freq: Once | ORAL | Status: AC
  Administered 2020-01-28: 1000 mg via ORAL
  Filled 2020-01-28: qty 2

## 2020-01-28 NOTE — ED Provider Notes (Signed)
[facility] EMERGENCY DEPARTMENT Provider Note   CSN: [id number] Arrival date & time: 01/28/20  1033     History Chief Complaint  Patient presents with  . Cough    + Covid  . Shortness of Breath    Joseph Dyer is a 44 y.o. male.  Presents to the emergency room with concern for cough, malaise.  Also having some mild shortness of breath.  Symptoms started approximately 3 days ago, initially cough, now feeling very fatigued, rundown.  No associated chest pain.  Had chills but notably no fevers.  No known Covid contacts.  Denies any chronic medical conditions.  HPI     Past Medical History:  Diagnosis Date  . Acid reflux    WATCHES DIET/  TAKES TUMS PRN  . Hypertension   . Nocturia   . Right hydrocele     There are no problems to display for this patient.   Past Surgical History:  Procedure Laterality Date  . HYDROCELE EXCISION Right 01/02/2013   Procedure: HYDROCELECTOMY ADULT;  Surgeon: Martina Sinner, MD;  Location: [facility];  Service: Urology;  Laterality: Right;       No family history on file.  Social History   Tobacco Use  . Smoking status: Current Every Day Smoker    Packs/day: 0.25    Years: 15.00    Pack years: 3.75    Types: Cigarettes  . Smokeless tobacco: Never Used  Substance Use Topics  . Alcohol use: Yes    Alcohol/week: 2.0 standard drinks    Types: 2 Standard drinks or equivalent per week  . Drug use: No    Home Medications Prior to Admission medications   Medication Sig Start Date End Date Taking? Authorizing Provider  amLODipine (NORVASC) 5 MG tablet Take 5 mg by mouth daily.    [provider]  calcium carbonate (TUMS - DOSED IN MG ELEMENTAL CALCIUM) 500 MG chewable tablet Chew 1 tablet by mouth as needed for heartburn.    [provider]  cephALEXin (KEFLEX) 250 MG capsule Take 1 capsule (250 mg total) by mouth 3 (three) times daily. 01/02/13   Alfredo Martinez, MD    HYDROcodone-acetaminophen (NORCO) 5-325 MG per tablet Take 1 tablet by mouth every 6 (six) hours as needed for pain. 01/02/13   Alfredo Martinez, MD  HYDROcodone-acetaminophen (NORCO/VICODIN) 5-325 MG per tablet Take 1 tablet by mouth every 6 (six) hours as needed for pain.    [provider]  ibuprofen (ADVIL,MOTRIN) 200 MG tablet Take 200 mg by mouth every 6 (six) hours as needed for pain.    [provider]    Allergies    Patient has no known allergies.  Review of Systems   Review of Systems  Constitutional: Positive for chills. Negative for fever.  HENT: Negative for ear pain and sore throat.   Eyes: Negative for pain and visual disturbance.  Respiratory: Positive for cough and shortness of breath.   Cardiovascular: Negative for chest pain and palpitations.  Gastrointestinal: Negative for abdominal pain and vomiting.  Genitourinary: Negative for dysuria and hematuria.  Musculoskeletal: Negative for arthralgias and back pain.  Skin: Negative for color change and rash.  Neurological: Negative for seizures and syncope.  All other systems reviewed and are negative.   Physical Exam Updated Vital Signs BP (!) 189/120 (BP Location: Right Arm)   Pulse 80   Temp 100.1 F (37.8 C) (Oral)   Resp 16   Ht 6' (1.829 m)  SpO2 94%   BMI 33.50 kg/m   Physical Exam Vitals and nursing note reviewed.  Constitutional:      Appearance: He is well-developed.  HENT:     Head: Normocephalic and atraumatic.  Eyes:     Conjunctiva/sclera: Conjunctivae normal.  Cardiovascular:     Rate and Rhythm: Normal rate and regular rhythm.     Heart sounds: No murmur heard.   Pulmonary:     Effort: Pulmonary effort is normal. No respiratory distress.     Breath sounds: Normal breath sounds.  Abdominal:     Palpations: Abdomen is soft.     Tenderness: There is no abdominal tenderness.  Musculoskeletal:     Cervical back: Neck supple.  Skin:    General: Skin is warm and dry.   Neurological:     General: No focal deficit present.     Mental Status: He is alert.     ED Results / Procedures / Treatments   Labs (all labs ordered are listed, but only abnormal results are displayed) Labs Reviewed  SARS CORONAVIRUS 2 BY RT PCR (HOSPITAL ORDER, PERFORMED IN [facility]) - Abnormal; Notable for the following components:      Result Value   SARS Coronavirus 2 POSITIVE (*)    All other components within normal limits    EKG EKG Interpretation  Date/Time:  Monday January 28 2020 10:54:12 EDT Ventricular Rate:  85 PR Interval:  190 QRS Duration: 100 QT Interval:  378 QTC Calculation: 449 R Axis:   -57 Text Interpretation: Normal sinus rhythm Possible Left atrial enlargement Incomplete right bundle branch block Left anterior fascicular block Minimal voltage criteria for LVH, may be normal variant ( Cornell product ) Nonspecific T wave abnormality Abnormal ECG Confirmed by Marianna Fuss ([id number]) on 01/28/2020 12:45:19 PM   Radiology DG Chest 2 View  Result Date: 01/28/2020 CLINICAL DATA:  Cough for 3 days, shortness of breath EXAM: CHEST - 2 VIEW COMPARISON:  None. FINDINGS: The heart size and mediastinal contours are within normal limits. Patchy bilateral airspace opacities most pronounced within the upper lobes. No pleural effusion. No pneumothorax. The visualized skeletal structures are unremarkable. IMPRESSION: Patchy bilateral airspace opacities most pronounced within the upper lobes, suspicious for multifocal pneumonia. Electronically Signed   By: Duanne Guess D.O.   On: 01/28/2020 11:12    Procedures Procedures (including critical care time)  Medications Ordered in ED Medications  acetaminophen (TYLENOL) tablet 1,000 mg (1,000 mg Oral Given 01/28/20 1255)    ED Course  I have reviewed the triage vital signs and the nursing notes.  Pertinent labs & imaging results that were available during my care of the patient were reviewed by me  and considered in my medical decision making (see chart for details).    MDM Rules/Calculators/A&P                          44 year old male otherwise healthy presents to ER with concern for cough, chills, fatigue.  On exam, patient noted to be coughing frequently but is otherwise remarkably well-appearing.  CXR noted patchy bilateral infiltrates.  Covid negative.  Suspect CXR findings most likely from COVID-19.  He has no significant tachypnea, no hypoxia.  Patient did well on ambulation trial.  At present, believe patient is appropriate for discharge and outpatient management.  Reviewed return precautions in detail with patient.  Stressed need to return to ER should he develop any increased work of breathing, difficulty  breathing, chest pain no other concerning symptom.  Reviewed isolation precautions.  Discharged home.  After the discussed management above, the patient was determined to be safe for discharge.  The patient was in agreement with this plan and all questions regarding their care were answered.  ED return precautions were discussed and the patient will return to the ED with any significant worsening of condition.  Joseph Dyer was evaluated in Emergency Department on 01/29/2020 for the symptoms described in the history of present illness. He was evaluated in the context of the global COVID-19 pandemic, which necessitated consideration that the patient might be at risk for infection with the SARS-CoV-2 virus that causes COVID-19. Institutional protocols and algorithms that pertain to the evaluation of patients at risk for COVID-19 are in a state of rapid change based on information released by regulatory bodies including the CDC and federal and state organizations. These policies and algorithms were followed during the patient's care in the ED.   Final Clinical Impression(s) / ED Diagnoses Final diagnoses:  COVID-19  Pneumonia due to COVID-19 virus    Rx / DC Orders ED Discharge  Orders    None       Milagros Loll, MD 01/29/20 1154

## 2020-01-28 NOTE — Discharge Instructions (Signed)
You have COVID-19.  Please follow isolation precautions as discussed.  If you have ANY increase in difficulty breathing, chest pain, passing out, vomiting or other new concerning symptom, please return immediately to ER for reassessment.  Recommend virtual recheck with primary doctor later this week.

## 2020-01-28 NOTE — ED Notes (Signed)
Ambulated in room 7, SpO2 98-99% on r/a, HR 88.

## 2020-01-28 NOTE — ED Triage Notes (Signed)
Cough x 3 days , shortness of breath today.

## 2020-01-29 ENCOUNTER — Telehealth: Payer: Self-pay | Admitting: Family

## 2020-01-29 NOTE — Telephone Encounter (Signed)
Called to Discuss with patient about Covid symptoms and the use of the monoclonal antibody infusion for those with mild to moderate Covid symptoms and at a high risk of hospitalization.     Pt appears to qualify for this infusion due to co-morbid conditions and/or a member of an at-risk group in accordance with the FDA Emergency Use Authorization.   Joseph Dyer was seen in [facility] ED on 8/30 with 3 day history of cough, malaise and shortness of breath. Risk factors include hypertension.    Unable to reach Joseph Dyer. Left voice message with hotline number. No MyChart activated.  Marcos Eke, NP

## 2020-02-12 ENCOUNTER — Emergency Department (HOSPITAL_BASED_OUTPATIENT_CLINIC_OR_DEPARTMENT_OTHER): Payer: BC Managed Care – PPO

## 2020-02-12 ENCOUNTER — Other Ambulatory Visit: Payer: Self-pay

## 2020-02-12 ENCOUNTER — Encounter (HOSPITAL_BASED_OUTPATIENT_CLINIC_OR_DEPARTMENT_OTHER): Payer: Self-pay

## 2020-02-12 ENCOUNTER — Encounter (HOSPITAL_BASED_OUTPATIENT_CLINIC_OR_DEPARTMENT_OTHER): Payer: Self-pay | Admitting: *Deleted

## 2020-02-12 ENCOUNTER — Emergency Department (HOSPITAL_BASED_OUTPATIENT_CLINIC_OR_DEPARTMENT_OTHER)
Admission: EM | Admit: 2020-02-12 | Discharge: 2020-02-12 | Disposition: A | Discharge status: Home / Self Care | Payer: BC Managed Care – PPO | Source: Home / Self Care | Attending: Emergency Medicine | Admitting: Emergency Medicine

## 2020-02-12 ENCOUNTER — Emergency Department (HOSPITAL_COMMUNITY)
Admission: EM | Admit: 2020-02-12 | Discharge: 2020-02-12 | Disposition: A | Discharge status: Home / Self Care | Payer: BC Managed Care – PPO

## 2020-02-12 ENCOUNTER — Inpatient Hospital Stay (HOSPITAL_BASED_OUTPATIENT_CLINIC_OR_DEPARTMENT_OTHER)
Admission: EM | Admit: 2020-02-12 | Discharge: 2020-02-17 | DRG: 301 | Disposition: A | Discharge status: Home / Self Care | Payer: BC Managed Care – PPO | Attending: Internal Medicine | Admitting: Internal Medicine

## 2020-02-12 DIAGNOSIS — I7103 Dissection of thoracoabdominal aorta: Secondary | ICD-10-CM | POA: Diagnosis not present

## 2020-02-12 DIAGNOSIS — K5900 Constipation, unspecified: Secondary | ICD-10-CM | POA: Diagnosis not present

## 2020-02-12 DIAGNOSIS — Z8616 Personal history of COVID-19: Secondary | ICD-10-CM | POA: Diagnosis not present

## 2020-02-12 DIAGNOSIS — I158 Other secondary hypertension: Secondary | ICD-10-CM | POA: Diagnosis not present

## 2020-02-12 DIAGNOSIS — E876 Hypokalemia: Secondary | ICD-10-CM | POA: Diagnosis present

## 2020-02-12 DIAGNOSIS — D649 Anemia, unspecified: Secondary | ICD-10-CM | POA: Diagnosis present

## 2020-02-12 DIAGNOSIS — I7101 Dissection of thoracic aorta: Secondary | ICD-10-CM | POA: Insufficient documentation

## 2020-02-12 DIAGNOSIS — I7100 Dissection of unspecified site of aorta: Secondary | ICD-10-CM | POA: Diagnosis not present

## 2020-02-12 DIAGNOSIS — K219 Gastro-esophageal reflux disease without esophagitis: Secondary | ICD-10-CM | POA: Diagnosis present

## 2020-02-12 DIAGNOSIS — R109 Unspecified abdominal pain: Secondary | ICD-10-CM | POA: Diagnosis not present

## 2020-02-12 DIAGNOSIS — Z79899 Other long term (current) drug therapy: Secondary | ICD-10-CM | POA: Insufficient documentation

## 2020-02-12 DIAGNOSIS — F17210 Nicotine dependence, cigarettes, uncomplicated: Secondary | ICD-10-CM | POA: Insufficient documentation

## 2020-02-12 DIAGNOSIS — I10 Essential (primary) hypertension: Secondary | ICD-10-CM | POA: Insufficient documentation

## 2020-02-12 DIAGNOSIS — I71012 Dissection of descending thoracic aorta: Secondary | ICD-10-CM

## 2020-02-12 LAB — COMPREHENSIVE METABOLIC PANEL
ALT: 18 U/L (ref 0–44)
AST: 29 U/L (ref 15–41)
Albumin: 3.4 g/dL — ABNORMAL LOW (ref 3.5–5.0)
Alkaline Phosphatase: 49 U/L (ref 38–126)
Anion gap: 12 (ref 5–15)
BUN: 10 mg/dL (ref 6–20)
CO2: 25 mmol/L (ref 22–32)
Calcium: 8.5 mg/dL — ABNORMAL LOW (ref 8.9–10.3)
Chloride: 101 mmol/L (ref 98–111)
Creatinine, Ser: 1.01 mg/dL (ref 0.61–1.24)
GFR calc Af Amer: >60 mL/min (ref >60)
GFR calc non Af Amer: >60 mL/min (ref >60)
Glucose, Bld: 207 mg/dL — ABNORMAL HIGH (ref 70–99)
Potassium: 2.6 mmol/L — CL (ref 3.5–5.1)
Sodium: 138 mmol/L (ref 135–145)
Total Bilirubin: 1 mg/dL (ref 0.3–1.2)
Total Protein: 6.8 g/dL (ref 6.5–8.1)

## 2020-02-12 LAB — PROTIME-INR
INR: 1.1 (ref 0.8–1.2)
Prothrombin Time: 13.8 seconds (ref 11.4–15.2)

## 2020-02-12 LAB — BASIC METABOLIC PANEL
Anion gap: 10 (ref 5–15)
BUN: 6 mg/dL (ref 6–20)
CO2: 24 mmol/L (ref 22–32)
Calcium: 8.4 mg/dL — ABNORMAL LOW (ref 8.9–10.3)
Chloride: 104 mmol/L (ref 98–111)
Creatinine, Ser: 0.91 mg/dL (ref 0.61–1.24)
GFR calc Af Amer: >60 mL/min (ref >60)
GFR calc non Af Amer: >60 mL/min (ref >60)
Glucose, Bld: 90 mg/dL (ref 70–99)
Potassium: 3.2 mmol/L — ABNORMAL LOW (ref 3.5–5.1)
Sodium: 138 mmol/L (ref 135–145)

## 2020-02-12 LAB — CBC WITH DIFFERENTIAL/PLATELET
Abs Immature Granulocytes: 0.04 10*3/uL (ref 0.00–0.07)
Basophils Absolute: 0.1 10*3/uL (ref 0.0–0.1)
Basophils Relative: 0 %
Eosinophils Absolute: 0.2 10*3/uL (ref 0.0–0.5)
Eosinophils Relative: 1 %
HCT: 35.6 % — ABNORMAL LOW (ref 39.0–52.0)
Hemoglobin: 11.5 g/dL — ABNORMAL LOW (ref 13.0–17.0)
Immature Granulocytes: 0 %
Lymphocytes Relative: 10 %
Lymphs Abs: 1.2 10*3/uL (ref 0.7–4.0)
MCH: 27.4 pg (ref 26.0–34.0)
MCHC: 32.3 g/dL (ref 30.0–36.0)
MCV: 84.8 fL (ref 80.0–100.0)
Monocytes Absolute: 1.3 10*3/uL — ABNORMAL HIGH (ref 0.1–1.0)
Monocytes Relative: 11 %
Neutro Abs: 9.3 10*3/uL — ABNORMAL HIGH (ref 1.7–7.7)
Neutrophils Relative %: 78 %
Platelets: 341 10*3/uL (ref 150–400)
RBC: 4.2 MIL/uL — ABNORMAL LOW (ref 4.22–5.81)
RDW: 15.9 % — ABNORMAL HIGH (ref 11.5–15.5)
WBC: 12.1 10*3/uL — ABNORMAL HIGH (ref 4.0–10.5)
nRBC: 0 % (ref 0.0–0.2)

## 2020-02-12 LAB — MAGNESIUM: Magnesium: 2.1 mg/dL (ref 1.7–2.4)

## 2020-02-12 LAB — MRSA PCR SCREENING: MRSA by PCR: NEGATIVE

## 2020-02-12 LAB — LIPASE, BLOOD: Lipase: 23 U/L (ref 11–51)

## 2020-02-12 LAB — LACTIC ACID, PLASMA: Lactic Acid, Venous: 1.6 mmol/L (ref 0.5–1.9)

## 2020-02-12 LAB — APTT: aPTT: 34 seconds (ref 24–36)

## 2020-02-12 LAB — HIV ANTIBODY (ROUTINE TESTING W REFLEX): HIV Screen 4th Generation wRfx: NONREACTIVE

## 2020-02-12 MED ORDER — SODIUM CHLORIDE 0.9 % IV SOLN: INTRAVENOUS | Status: DC | PRN

## 2020-02-12 MED ORDER — DOCUSATE SODIUM 100 MG PO CAPS
100.0000 mg | ORAL_CAPSULE | Freq: Two times a day (BID) | ORAL | Status: DC | PRN
  Administered 2020-02-14 – 2020-02-17 (×4): 100 mg via ORAL
  Filled 2020-02-12 (×4): qty 1

## 2020-02-12 MED ORDER — POLYETHYLENE GLYCOL 3350 17 G PO PACK
17.0000 g | PACK | Freq: Every day | ORAL | Status: DC | PRN
  Administered 2020-02-14 – 2020-02-15 (×2): 17 g via ORAL
  Filled 2020-02-12 (×3): qty 1

## 2020-02-12 MED ORDER — POTASSIUM CHLORIDE 10 MEQ/100ML IV SOLN
10.0000 meq | Freq: Once | INTRAVENOUS | Status: AC
  Administered 2020-02-12: 10 meq via INTRAVENOUS
  Filled 2020-02-12: qty 100

## 2020-02-12 MED ORDER — CHLORHEXIDINE GLUCONATE CLOTH 2 % EX PADS
6.0000 | MEDICATED_PAD | Freq: Every day | CUTANEOUS | Status: DC
  Administered 2020-02-12 – 2020-02-14 (×3): 6 via TOPICAL

## 2020-02-12 MED ORDER — HYDROMORPHONE HCL 1 MG/ML IJ SOLN
1.0000 mg | INTRAMUSCULAR | Status: DC | PRN
  Administered 2020-02-12: 1 mg via INTRAVENOUS
  Filled 2020-02-12: qty 1

## 2020-02-12 MED ORDER — IOHEXOL 350 MG/ML SOLN
100.0000 mL | Freq: Once | INTRAVENOUS | Status: AC | PRN
  Administered 2020-02-12: 100 mL via INTRAVENOUS

## 2020-02-12 MED ORDER — NICARDIPINE HCL IN NACL 20-0.86 MG/200ML-% IV SOLN
3.0000 mg/h | INTRAVENOUS | Status: DC
  Administered 2020-02-12: 5 mg/h via INTRAVENOUS
  Administered 2020-02-12: 11.5 mg/h via INTRAVENOUS
  Administered 2020-02-12: 15 mg/h via INTRAVENOUS
  Administered 2020-02-12: 8.5 mg/h via INTRAVENOUS
  Administered 2020-02-13: 15 mg/h via INTRAVENOUS
  Administered 2020-02-13: 9 mg/h via INTRAVENOUS
  Administered 2020-02-13: 15 mg/h via INTRAVENOUS
  Administered 2020-02-13: 9 mg/h via INTRAVENOUS
  Administered 2020-02-13: 12.5 mg/h via INTRAVENOUS
  Administered 2020-02-13: 15 mg/h via INTRAVENOUS
  Administered 2020-02-13: 12.5 mg/h via INTRAVENOUS
  Administered 2020-02-13: 13 mg/h via INTRAVENOUS
  Administered 2020-02-13 (×2): 12.5 mg/h via INTRAVENOUS
  Administered 2020-02-13: 11.5 mg/h via INTRAVENOUS
  Administered 2020-02-13: 12.5 mg/h via INTRAVENOUS
  Administered 2020-02-13: 15 mg/h via INTRAVENOUS
  Administered 2020-02-13 (×3): 12.5 mg/h via INTRAVENOUS
  Administered 2020-02-14: 10 mg/h via INTRAVENOUS
  Administered 2020-02-14 (×4): 15 mg/h via INTRAVENOUS
  Administered 2020-02-14: 10 mg/h via INTRAVENOUS
  Administered 2020-02-14: 12 mg/h via INTRAVENOUS
  Administered 2020-02-14 (×2): 10 mg/h via INTRAVENOUS
  Administered 2020-02-14: 11 mg/h via INTRAVENOUS
  Administered 2020-02-14: 15 mg/h via INTRAVENOUS
  Administered 2020-02-14: 10 mg/h via INTRAVENOUS
  Administered 2020-02-14: 12 mg/h via INTRAVENOUS
  Administered 2020-02-14: 15 mg/h via INTRAVENOUS
  Administered 2020-02-15 (×3): 5 mg/h via INTRAVENOUS
  Filled 2020-02-12 (×47): qty 200

## 2020-02-12 MED ORDER — HYDROMORPHONE HCL 1 MG/ML IJ SOLN
3.0000 mg | INTRAMUSCULAR | Status: DC | PRN
  Administered 2020-02-12 – 2020-02-13 (×5): 3 mg via INTRAVENOUS
  Filled 2020-02-12 (×5): qty 3

## 2020-02-12 MED ORDER — EPOETIN ALFA-EPBX 40000 UNIT/ML IJ SOLN
INTRAMUSCULAR | Status: AC
  Filled 2020-02-12: qty 5

## 2020-02-12 MED ORDER — ONDANSETRON HCL 4 MG/2ML IJ SOLN
4.0000 mg | Freq: Once | INTRAMUSCULAR | Status: AC
  Administered 2020-02-12: 4 mg via INTRAVENOUS
  Filled 2020-02-12: qty 2

## 2020-02-12 MED ORDER — HYDROMORPHONE HCL 1 MG/ML IJ SOLN
1.0000 mg | Freq: Once | INTRAMUSCULAR | Status: AC
  Administered 2020-02-12: 1 mg via INTRAVENOUS
  Filled 2020-02-12: qty 1

## 2020-02-12 MED ORDER — NICARDIPINE HCL IN NACL 20-0.86 MG/200ML-% IV SOLN
3.0000 mg/h | INTRAVENOUS | Status: DC
  Filled 2020-02-12: qty 200

## 2020-02-12 MED ORDER — LABETALOL HCL 5 MG/ML IV SOLN
10.0000 mg | Freq: Once | INTRAVENOUS | Status: AC
  Administered 2020-02-12: 10 mg via INTRAVENOUS
  Filled 2020-02-12: qty 4

## 2020-02-12 MED ORDER — IOHEXOL 300 MG/ML  SOLN
100.0000 mL | Freq: Once | INTRAMUSCULAR | Status: AC | PRN
  Administered 2020-02-12: 100 mL via INTRAVENOUS

## 2020-02-12 MED ORDER — ONDANSETRON HCL 4 MG/2ML IJ SOLN
4.0000 mg | Freq: Four times a day (QID) | INTRAMUSCULAR | Status: DC | PRN
  Administered 2020-02-12: 4 mg via INTRAVENOUS
  Filled 2020-02-12: qty 2

## 2020-02-12 MED ORDER — DARBEPOETIN ALFA 300 MCG/0.6ML IJ SOSY
PREFILLED_SYRINGE | INTRAMUSCULAR | Status: AC
  Filled 2020-02-12: qty 1.8

## 2020-02-12 MED ORDER — CYANOCOBALAMIN 1000 MCG/ML IJ SOLN
INTRAMUSCULAR | Status: AC
  Filled 2020-02-12: qty 1

## 2020-02-12 MED ORDER — MORPHINE SULFATE (PF) 4 MG/ML IV SOLN
4.0000 mg | Freq: Once | INTRAVENOUS | Status: AC
  Administered 2020-02-12: 4 mg via INTRAVENOUS
  Filled 2020-02-12: qty 1

## 2020-02-12 MED ORDER — POTASSIUM CHLORIDE CRYS ER 20 MEQ PO TBCR
40.0000 meq | EXTENDED_RELEASE_TABLET | Freq: Once | ORAL | Status: AC
  Administered 2020-02-12: 40 meq via ORAL
  Filled 2020-02-12: qty 2

## 2020-02-12 NOTE — ED Notes (Signed)
Pt states he has no choice but to leave so he can get his belongings from the hotel but will return as soon as he can

## 2020-02-12 NOTE — ED Notes (Signed)
Eloped prior to triage . Called 3X.  

## 2020-02-12 NOTE — ED Provider Notes (Signed)
[facility] EMERGENCY DEPARTMENT Provider Note   CSN: [id number] Arrival date & time: 02/12/20  0036     History Chief Complaint  Patient presents with  . Abdominal Pain    covid +    Joseph Dyer is a 44 y.o. male.  Patient presents to the emergency department with a chief complaint of abdominal pain.  He reports that he has been having abdominal pain for the past several days.  States that he has not had a bowel movement in about 4 days.  States that he is normally very regular.  He also reports that he is recovering from COVID-19.  He states that his abdominal pain is mainly in his epigastric region.  He denies any recent fevers or chills.  States that his breathing is improved.  The history is provided by the patient. No language interpreter was used.       Past Medical History:  Diagnosis Date  . Acid reflux    WATCHES DIET/  TAKES TUMS PRN  . Hypertension   . Nocturia   . Right hydrocele     There are no problems to display for this patient.   Past Surgical History:  Procedure Laterality Date  . HYDROCELE EXCISION Right 01/02/2013   Procedure: HYDROCELECTOMY ADULT;  Surgeon: Martina Sinner, MD;  Location: [facility];  Service: Urology;  Laterality: Right;       No family history on file.  Social History   Tobacco Use  . Smoking status: Current Every Day Smoker    Packs/day: 0.25    Years: 15.00    Pack years: 3.75    Types: Cigarettes  . Smokeless tobacco: Never Used  Substance Use Topics  . Alcohol use: Yes    Alcohol/week: 2.0 standard drinks    Types: 2 Standard drinks or equivalent per week  . Drug use: No    Home Medications Prior to Admission medications   Medication Sig Start Date End Date Taking? Authorizing Provider  amLODipine (NORVASC) 5 MG tablet Take 5 mg by mouth daily.    [provider]  calcium carbonate (TUMS - DOSED IN MG ELEMENTAL CALCIUM) 500 MG chewable tablet Chew 1 tablet by mouth as  needed for heartburn.    [provider]  cephALEXin (KEFLEX) 250 MG capsule Take 1 capsule (250 mg total) by mouth 3 (three) times daily. 01/02/13   Alfredo Martinez, MD  HYDROcodone-acetaminophen (NORCO) 5-325 MG per tablet Take 1 tablet by mouth every 6 (six) hours as needed for pain. 01/02/13   Alfredo Martinez, MD  HYDROcodone-acetaminophen (NORCO/VICODIN) 5-325 MG per tablet Take 1 tablet by mouth every 6 (six) hours as needed for pain.    [provider]  ibuprofen (ADVIL,MOTRIN) 200 MG tablet Take 200 mg by mouth every 6 (six) hours as needed for pain.    [provider]    Allergies    Patient has no known allergies.  Review of Systems   Review of Systems  All other systems reviewed and are negative.   Physical Exam Updated Vital Signs BP (!) 203/125 (BP Location: Left Arm)   Pulse 60   Temp 98.2 F (36.8 C) (Oral)   Resp 18   Ht  (1.753 m)   Wt 81.6 kg   SpO2 95%   BMI 26.58 kg/m   Physical Exam Vitals and nursing note reviewed.  Constitutional:      Appearance: He is well-developed.  HENT:     Head: Normocephalic  and atraumatic.  Eyes:     Conjunctiva/sclera: Conjunctivae normal.  Cardiovascular:     Rate and Rhythm: Normal rate and regular rhythm.     Heart sounds: No murmur heard.   Pulmonary:     Effort: Pulmonary effort is normal. No respiratory distress.     Breath sounds: Normal breath sounds.  Abdominal:     Palpations: Abdomen is soft.     Tenderness: There is no abdominal tenderness.     Comments: Generalized abdominal discomfort, but without focal tenderness  Musculoskeletal:        General: Normal range of motion.     Cervical back: Neck supple.  Skin:    General: Skin is warm and dry.  Neurological:     Mental Status: He is alert and oriented to person, place, and time.  Psychiatric:        Mood and Affect: Mood normal.        Behavior: Behavior normal.     ED Results / Procedures / Treatments    Labs (all labs ordered are listed, but only abnormal results are displayed) Labs Reviewed  CBC WITH DIFFERENTIAL/PLATELET - Abnormal; Notable for the following components:      Result Value   WBC 12.1 (*)    RBC 4.20 (*)    Hemoglobin 11.5 (*)    HCT 35.6 (*)    RDW 15.9 (*)    Neutro Abs 9.3 (*)    Monocytes Absolute 1.3 (*)    All other components within normal limits  COMPREHENSIVE METABOLIC PANEL - Abnormal; Notable for the following components:   Potassium 2.6 (*)    Glucose, Bld 207 (*)    Calcium 8.5 (*)    Albumin 3.4 (*)    All other components within normal limits  LIPASE, BLOOD  LACTIC ACID, PLASMA  RAPID URINE DRUG SCREEN, HOSP PERFORMED    EKG None  Radiology DG Abd 1 View  Result Date: 02/12/2020 CLINICAL DATA:  Abdominal pain.  COVID pneumonia. EXAM: ABDOMEN - 1 VIEW COMPARISON:  None. FINDINGS: Normal bowel gas pattern. No excessive stool retention. There is a rounded calcified density over the right lower quadrant measuring 6 mm. Clear lung bases. L4-5 disc degeneration. IMPRESSION: 1. Normal bowel gas pattern. 2. Calcification over the right lower quadrant which could be appendicolith. Electronically Signed   By: Marnee Spring M.D.   On: 02/12/2020 04:30   CT ABDOMEN PELVIS W CONTRAST  Result Date: 02/12/2020 CLINICAL DATA:  Abdominal pain for 2 weeks.  COVID positive. EXAM: CT ABDOMEN AND PELVIS WITH CONTRAST TECHNIQUE: Multidetector CT imaging of the abdomen and pelvis was performed using the standard protocol following bolus administration of intravenous contrast. CONTRAST:  OMNIPAQUE IOHEXOL 300 MG/ML  SOLN COMPARISON:  None. FINDINGS: Lower chest: Minimal patchy density at the lung bases, there is subsequent chest CT and recent history of COVID-19. Hepatobiliary: No focal liver abnormality.No evidence of biliary obstruction or stone. Pancreas: 2.6 cm cystic density between the stomach and pancreatic tail. No evidence of acute pancreatitis. No visible  complexity to the cyst. Spleen: Unremarkable. Adrenals/Urinary Tract: Negative adrenals. No hydronephrosis or stone. Unremarkable bladder. Stomach/Bowel: No focal inflammation or appendicitis . No bowel obstruction Vascular/Lymphatic: Aortic iliac dissection and retroperitoneal stranding. Compromised celiac axis. No mass or adenopathy. Reproductive:No pathologic findings. Other: No ascites or pneumoperitoneum. Musculoskeletal: L4-5 disc space collapse with endplate irregularity but degenerative and chronic appearing with vacuum phenomenon arguing against discitis. The retroperitoneal inflammatory changes also extend well beyond this disc  space. Critical Value/emergent results were called by telephone at the time of interpretation on 02/12/2020 at 5:50 am to provider Dr Eudelia Bunch, who verbally acknowledged these results. IMPRESSION: 1. 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 CTA was immediately performed after this study, please reference that report. 2. 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 Please correlate for history of pancreatitis. After convalescence enhanced MRI is recommended if there is no known pancreatitis history. 3. Premature atherosclerosis. Electronically Signed   By: Marnee Spring M.D.   On: 02/12/2020 05:51   DG Chest Portable 1 View  Result Date: 02/12/2020 CLINICAL DATA:  COVID short of breath EXAM: PORTABLE CHEST 1 VIEW COMPARISON:  01/28/2020 FINDINGS: Improved aeration bilaterally. No acute consolidation or effusion. Stable cardiomediastinal silhouette. No pneumothorax. IMPRESSION: Improved aeration bilaterally. No focal pulmonary infiltrate. Electronically Signed   By: Jasmine Pang M.D.   On: 02/12/2020 01:39   CT Angio Chest/Abd/Pel for Dissection W and/or Wo Contrast  Result Date: 02/12/2020 CLINICAL DATA:  Thoracic aortic aneurysm suspected. EXAM: CT ANGIOGRAPHY CHEST, ABDOMEN AND PELVIS TECHNIQUE:  Non-contrast CT of the chest was initially obtained. Multidetector CT imaging through the chest, abdomen and pelvis was performed using the standard protocol during bolus administration of intravenous contrast. Multiplanar reconstructed images and MIPs were obtained and reviewed to evaluate the vascular anatomy. CONTRAST:  OMNIPAQUE IOHEXOL 350 MG/ML SOLN COMPARISON:  None. FINDINGS: CTA CHEST FINDINGS Cardiovascular: A noncontrast phase shows no intramural hematoma in the aorta. Probable left ventricular hypertrophy. No pericardial effusion. No pulmonary artery filling defects. Aortic dissection with flap beginning at the isthmus. Relatively symmetric enhancement of the 2 lumens in the chest. No aortic aneurysm. Mediastinum/Nodes: Negative for hematoma or inflammatory change. Lungs/Pleura: Mild patchy reticular density correlating with recent COVID pneumonia. No honeycombing, effusion, or air leak. Musculoskeletal: Spondylosis. Review of the MIP images confirms the above findings. CTA ABDOMEN AND PELVIS FINDINGS VASCULAR Aorta: Aortic dissection with relatively symmetric enhancement of the lumens in the arterial phase, less so in the portal venous phase. There is atheromatous calcification and intimal thickening that is premature for age. No discrete enhancing thick wall based on the abdomen and pelvis CT, but there is generalized retroperitoneal stranding. Celiac: There is a separate origin of the left gastric which arises from the true lumen and is widely patent. The remaining celiac branches are truncated at the false lumen, but still enhance due to reconstitution. Phrenic branches arise from the aorta and are patent. SMA: Arises from the true lumen. No downstream branch occlusion or stenosis is seen Renals: Single bilateral renal artery. Early branching on the left. No asymmetric enhancement or aneurysm seen IMA: Patent, from the false lumen Inflow: Dissection continues into the bilateral external iliac  arteries, more extensive on the right where there is slightly less intense luminal enhancement. Veins: There is stranding around the iliac veins and lower IVC without vein expansion and negative history for leg swelling. Review of the MIP images confirms the above findings. NON-VASCULAR Nonvascular findings are described on preceding parenchymal phase abdomen and pelvis CT. Critical Value/emergent results were called by telephone at the time of interpretation on 02/12/2020 at 6:01 am to provider Dr Eudelia Bunch, who verbally acknowledged these results. Review of the MIP images confirms the above findings. IMPRESSION: 1. 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 2. There is retroperitoneal stranding about the aorta and iliacs which is presumably related to the dissection. The diffuseness  of stranding suggests inflammation rather than leakage, but there is no aneurysm or clear aortitis. Recommend vascular surgery consultation and consideration of short term follow-up CT. 3. 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 4. Symmetric lumen enhancement in the arterial phase without evidence of visceral ischemia. 5. Premature atherosclerosis. Electronically Signed   By: Marnee Spring M.D.   On: 02/12/2020 06:04    Procedures .Critical Care Performed by: Roxy Horseman, PA-C Authorized by: Roxy Horseman, PA-C   Critical care provider statement:    Critical care time (minutes):  50   Critical care was necessary to treat or prevent imminent or life-threatening deterioration of the following conditions:  Circulatory failure   Critical care was time spent personally by me on the following activities:  Discussions with consultants, evaluation of patient's response to treatment, examination of patient, ordering and performing treatments and interventions, ordering and review of laboratory  studies, ordering and review of radiographic studies, pulse oximetry, re-evaluation of patient's condition, obtaining history from patient or surrogate and review of old charts   (including critical care time)  Medications Ordered in ED Medications  potassium chloride 10 mEq in 100 mL IVPB (10 mEq Intravenous New Bag/Given 02/12/20 0540)  nicardipine (CARDENE) 20mg  in 0.86% saline IV infusion (0.1 mg/ml) (has no administration in time range)  0.9 %  sodium chloride infusion (has no administration in time range)  iohexol (OMNIPAQUE) 300 MG/ML solution 100 mL (100 mLs Intravenous Contrast Given 02/12/20 0451)  potassium chloride SA (KLOR-CON) CR tablet 40 mEq (40 mEq Oral Given 02/12/20 0537)  iohexol (OMNIPAQUE) 350 MG/ML injection 100 mL (100 mLs Intravenous Contrast Given 02/12/20 0513)  morphine 4 MG/ML injection 4 mg (4 mg Intravenous Given 02/12/20 0545)  ondansetron (ZOFRAN) injection 4 mg (4 mg Intravenous Given 02/12/20 0543)    ED Course  I have reviewed the triage vital signs and the nursing notes.  Pertinent labs & imaging results that were available during my care of the patient were reviewed by me and considered in my medical decision making (see chart for details).    MDM Rules/Calculators/A&P                          This patient complains of abdominal pain, this involves an extensive number of treatment options, and is a complaint that carries with it a high risk of complications and morbidity.    Differential Dx Constipation, GERD, appendicitis, SBO  Pertinent Labs I ordered, reviewed, and interpreted labs, which included CBC notable for leukocytosis to 12.1, hemoglobin 11.5, potassium 2.6, lactic 1.6  Imaging Interpretation I ordered imaging studies which included chest x-ray, plain films of the abdomen, and CT abdomen, which showed dissection with compromise of the celiac axis and retroperitoneal stranding, CT dissection study was ordered which confirmed these  findings.   Medications I ordered medication morphine, Zofran, and nicardipine infusion for pain and blood pressure control.  Critical Interventions  Nicardipine infusion  Reassessments After the interventions stated above, I reevaluated the patient and found disagreeable with current admission plan.  Patient states that he is homeless and has to "move his stuff."  Consultants Dr. 02/14/20 spoke with intensivist.  Will call back if patient decides to stay.  Recommend consulting with vascular.  Plan  Patient signed out to Dr. Eudelia Bunch, who will continue care.    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

## 2020-02-12 NOTE — ED Provider Notes (Signed)
[facility] EMERGENCY DEPARTMENT Provider Note   CSN: [id number] Arrival date & time: 02/12/20  5726     History Chief Complaint  Patient presents with  . Shortness of Breath    Joseph Dyer is a 44 y.o. male.   Shortness of Breath Severity:  Moderate Onset quality:  Gradual Timing:  Intermittent Progression:  Waxing and waning Chronicity:  New Context: URI (had covid and was cleared)   Relieved by:  Nothing Worsened by:  Nothing Ineffective treatments:  None tried Associated symptoms: chest pain   Associated symptoms: no cough, no fever, no headaches, no rash and no vomiting        Past Medical History:  Diagnosis Date  . Acid reflux    WATCHES DIET/  TAKES TUMS PRN  . Hypertension   . Nocturia   . Right hydrocele     There are no problems to display for this patient.   Past Surgical History:  Procedure Laterality Date  . HYDROCELE EXCISION Right 01/02/2013   Procedure: HYDROCELECTOMY ADULT;  Surgeon: Martina Sinner, MD;  Location: [facility];  Service: Urology;  Laterality: Right;       No family history on file.  Social History   Tobacco Use  . Smoking status: Current Every Day Smoker    Packs/day: 0.25    Years: 15.00    Pack years: 3.75    Types: Cigarettes  . Smokeless tobacco: Never Used  Substance Use Topics  . Alcohol use: Yes    Alcohol/week: 2.0 standard drinks    Types: 2 Standard drinks or equivalent per week  . Drug use: No    Home Medications Prior to Admission medications   Medication Sig Start Date End Date Taking? Authorizing Provider  amLODipine (NORVASC) 5 MG tablet Take 5 mg by mouth daily.    [provider]  calcium carbonate (TUMS - DOSED IN MG ELEMENTAL CALCIUM) 500 MG chewable tablet Chew 1 tablet by mouth as needed for heartburn.    [provider]  cephALEXin (KEFLEX) 250 MG capsule Take 1 capsule (250 mg total) by mouth 3 (three) times daily. 01/02/13   Alfredo Martinez, MD  HYDROcodone-acetaminophen (NORCO) 5-325 MG per tablet Take 1 tablet by mouth every 6 (six) hours as needed for pain. 01/02/13   Alfredo Martinez, MD  HYDROcodone-acetaminophen (NORCO/VICODIN) 5-325 MG per tablet Take 1 tablet by mouth every 6 (six) hours as needed for pain.    [provider]  ibuprofen (ADVIL,MOTRIN) 200 MG tablet Take 200 mg by mouth every 6 (six) hours as needed for pain.    [provider]    Allergies    Patient has no known allergies.  Review of Systems   Review of Systems  Constitutional: Positive for fatigue. Negative for chills and fever.  HENT: Negative for congestion and rhinorrhea.   Respiratory: Positive for shortness of breath. Negative for cough.   Cardiovascular: Positive for chest pain. Negative for palpitations.  Gastrointestinal: Negative for diarrhea, nausea and vomiting.  Genitourinary: Negative for difficulty urinating and dysuria.  Musculoskeletal: Negative for arthralgias and back pain.  Skin: Negative for color change and rash.  Neurological: Negative for light-headedness and headaches.    Physical Exam Updated Vital Signs BP (!) 179/79   Pulse 80   Temp 98.5 F (36.9 C) (Oral)   Resp (!) 29   Ht 6' (1.829 m)   Wt 81.6 kg   SpO2 98%   BMI 24.41 kg/m   Physical  Exam Vitals and nursing note reviewed.  Constitutional:      General: He is not in acute distress.    Appearance: Normal appearance.  HENT:     Head: Normocephalic and atraumatic.     Nose: No rhinorrhea.  Eyes:     General:        Right eye: No discharge.        Left eye: No discharge.     Conjunctiva/sclera: Conjunctivae normal.  Cardiovascular:     Rate and Rhythm: Normal rate and regular rhythm.  No extrasystoles are present.    Pulses: Normal pulses. No decreased pulses.     Heart sounds: No murmur heard.   Pulmonary:     Effort: Pulmonary effort is normal.     Breath sounds: No stridor. No decreased breath sounds.  Abdominal:      General: Abdomen is flat. There is no distension.     Palpations: Abdomen is soft.  Musculoskeletal:        General: No deformity or signs of injury.  Skin:    General: Skin is warm and dry.  Neurological:     General: No focal deficit present.     Mental Status: He is alert. Mental status is at baseline.     Motor: No weakness.  Psychiatric:        Mood and Affect: Mood normal.        Behavior: Behavior normal.        Thought Content: Thought content normal.     ED Results / Procedures / Treatments   Labs (all labs ordered are listed, but only abnormal results are displayed) Labs Reviewed - No data to display  EKG None  Radiology DG Abd 1 View  Result Date: 02/12/2020 CLINICAL DATA:  Abdominal pain.  COVID pneumonia. EXAM: ABDOMEN - 1 VIEW COMPARISON:  None. FINDINGS: Normal bowel gas pattern. No excessive stool retention. There is a rounded calcified density over the right lower quadrant measuring 6 mm. Clear lung bases. L4-5 disc degeneration. IMPRESSION: 1. Normal bowel gas pattern. 2. Calcification over the right lower quadrant which could be appendicolith. Electronically Signed   By: Marnee Spring M.D.   On: 02/12/2020 04:30   CT ABDOMEN PELVIS W CONTRAST  Result Date: 02/12/2020 CLINICAL DATA:  Abdominal pain for 2 weeks.  COVID positive. EXAM: CT ABDOMEN AND PELVIS WITH CONTRAST TECHNIQUE: Multidetector CT imaging of the abdomen and pelvis was performed using the standard protocol following bolus administration of intravenous contrast. CONTRAST:  OMNIPAQUE IOHEXOL 300 MG/ML  SOLN COMPARISON:  None. FINDINGS: Lower chest: Minimal patchy density at the lung bases, there is subsequent chest CT and recent history of COVID-19. Hepatobiliary: No focal liver abnormality.No evidence of biliary obstruction or stone. Pancreas: 2.6 cm cystic density between the stomach and pancreatic tail. No evidence of acute pancreatitis. No visible complexity to the cyst. Spleen: Unremarkable.  Adrenals/Urinary Tract: Negative adrenals. No hydronephrosis or stone. Unremarkable bladder. Stomach/Bowel: No focal inflammation or appendicitis . No bowel obstruction Vascular/Lymphatic: Aortic iliac dissection and retroperitoneal stranding. Compromised celiac axis. No mass or adenopathy. Reproductive:No pathologic findings. Other: No ascites or pneumoperitoneum. Musculoskeletal: L4-5 disc space collapse with endplate irregularity but degenerative and chronic appearing with vacuum phenomenon arguing against discitis. The retroperitoneal inflammatory changes also extend well beyond this disc space. Critical Value/emergent results were called by telephone at the time of interpretation on 02/12/2020 at 5:50 am to provider Dr Eudelia Bunch, who verbally acknowledged these results. IMPRESSION: 1. 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 CTA was immediately performed after this study, please reference that report. 2. 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 Please correlate for history of pancreatitis. After convalescence enhanced MRI is recommended if there is no known pancreatitis history. 3. Premature atherosclerosis. Electronically Signed   By: Marnee SpringJonathon  Watts M.D.   On: 02/12/2020 05:51   DG Chest Portable 1 View  Result Date: 02/12/2020 CLINICAL DATA:  COVID short of breath EXAM: PORTABLE CHEST 1 VIEW COMPARISON:  01/28/2020 FINDINGS: Improved aeration bilaterally. No acute consolidation or effusion. Stable cardiomediastinal silhouette. No pneumothorax. IMPRESSION: Improved aeration bilaterally. No focal pulmonary infiltrate. Electronically Signed   By: Jasmine PangKim  Fujinaga M.D.   On: 02/12/2020 01:39   CT Angio Chest/Abd/Pel for Dissection W and/or Wo Contrast  Result Date: 02/12/2020 CLINICAL DATA:  Thoracic aortic aneurysm suspected. EXAM: CT ANGIOGRAPHY CHEST, ABDOMEN AND PELVIS TECHNIQUE: Non-contrast CT of the chest was initially  obtained. Multidetector CT imaging through the chest, abdomen and pelvis was performed using the standard protocol during bolus administration of intravenous contrast. Multiplanar reconstructed images and MIPs were obtained and reviewed to evaluate the vascular anatomy. CONTRAST:  100mL OMNIPAQUE IOHEXOL 350 MG/ML SOLN COMPARISON:  None. FINDINGS: CTA CHEST FINDINGS Cardiovascular: A noncontrast phase shows no intramural hematoma in the aorta. Probable left ventricular hypertrophy. No pericardial effusion. No pulmonary artery filling defects. Aortic dissection with flap beginning at the isthmus. Relatively symmetric enhancement of the 2 lumens in the chest. No aortic aneurysm. Mediastinum/Nodes: Negative for hematoma or inflammatory change. Lungs/Pleura: Mild patchy reticular density correlating with recent COVID pneumonia. No honeycombing, effusion, or air leak. Musculoskeletal: Spondylosis. Review of the MIP images confirms the above findings. CTA ABDOMEN AND PELVIS FINDINGS VASCULAR Aorta: Aortic dissection with relatively symmetric enhancement of the lumens in the arterial phase, less so in the portal venous phase. There is atheromatous calcification and intimal thickening that is premature for age. No discrete enhancing thick wall based on the abdomen and pelvis CT, but there is generalized retroperitoneal stranding. Celiac: There is a separate origin of the left gastric which arises from the true lumen and is widely patent. The remaining celiac branches are truncated at the false lumen, but still enhance due to reconstitution. Phrenic branches arise from the aorta and are patent. SMA: Arises from the true lumen. No downstream branch occlusion or stenosis is seen Renals: Single bilateral renal artery. Early branching on the left. No asymmetric enhancement or aneurysm seen IMA: Patent, from the false lumen Inflow: Dissection continues into the bilateral external iliac arteries, more extensive on the right where  there is slightly less intense luminal enhancement. Veins: There is stranding around the iliac veins and lower IVC without vein expansion and negative history for leg swelling. Review of the MIP images confirms the above findings. NON-VASCULAR Nonvascular findings are described on preceding parenchymal phase abdomen and pelvis CT. Critical Value/emergent results were called by telephone at the time of interpretation on 02/12/2020 at 6:01 am to provider Dr Eudelia Bunchardama, who verbally acknowledged these results. Review of the MIP images confirms the above findings. IMPRESSION: 1. 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 2. There is retroperitoneal stranding about the aorta and iliacs which is presumably related to the dissection. The diffuseness of stranding suggests inflammation rather than leakage, but there is no aneurysm or clear aortitis. Recommend vascular surgery consultation and consideration of short term follow-up CT. 3. Associated compromise of the main celiac  trunk (separate left gastric origin) with reconstituted hepatic and splenic arteries. 4. Symmetric lumen enhancement in the arterial phase without evidence of visceral ischemia. 5. Premature atherosclerosis. Electronically Signed   By: Marnee Spring M.D.   On: 02/12/2020 06:04    Procedures .Critical Care Performed by: Sabino Donovan, MD Authorized by: Sabino Donovan, MD   Critical care provider statement:    Critical care time (minutes):  45   Critical care was necessary to treat or prevent imminent or life-threatening deterioration of the following conditions:  Circulatory failure   Critical care was time spent personally by me on the following activities:  Discussions with consultants, evaluation of patient's response to treatment, examination of patient, ordering and performing treatments and interventions, ordering and review of laboratory studies, ordering and review of radiographic studies,  pulse oximetry, re-evaluation of patient's condition, obtaining history from patient or surrogate, review of old charts and development of treatment plan with patient or surrogate   (including critical care time)  Medications Ordered in ED Medications  nicardipine (CARDENE) 20mg  in 0.86% saline IV infusion (0.1 mg/ml) (12.5 mg/hr Intravenous Rate/Dose Change 02/12/20 0913)    ED Course  I have reviewed the triage vital signs and the nursing notes.  Pertinent labs & imaging results that were available during my care of the patient were reviewed by me and considered in my medical decision making (see chart for details).    MDM Rules/Calculators/A&P                          Concern for aortic dissection on CT, previously left AGAINST MEDICAL ADVICE but has not returned for treatment.  Hypertensive but normal heart rate will start nicardipine drip titrate goal of blood pressure to 140.  Otherwise he is resting comfortably well-appearing with normal vitals.  Vascular surgery and intensive care already aware of this patient by will reach out to reconsulted.  No needed labs in addition to once been done so far.  CRITICAL CARE Performed by: 02/14/20   Total critical care time: 35 minutes  Critical care time was exclusive of separately billable procedures and treating other patients.  Critical care was necessary to treat or prevent imminent or life-threatening deterioration.  Critical care was time spent personally by me on the following activities: development of treatment plan with patient and/or surrogate as well as nursing, discussions with consultants, evaluation of patient's response to treatment, examination of patient, obtaining history from patient or surrogate, ordering and performing treatments and interventions, ordering and review of laboratory studies, ordering and review of radiographic studies, pulse oximetry and re-evaluation of patient's condition.   I spoke with  vascular surgery, I spoke with intensive care, they recommend transfer to their facility for further management with hypertension and heart rate control.  They agree with Cardene choice at this time, he does not need anything for pain control he is resting comfortably he is told to be n.p.o.  His blood pressure is responding well to Cardene.  The patient will be transferred to [facility] emergency department.  For the remainder this patient's care please see inpatient/ED team notes.  I will intervene as needed while the patient remains in the emergency department.   Final Clinical Impression(s) / ED Diagnoses Final diagnoses:  Dissection of aorta, unspecified portion of aorta [facility])    Rx / DC Orders ED Discharge Orders    None       [facility]  C, MD 02/12/20 [phone]

## 2020-02-12 NOTE — ED Notes (Signed)
Just spoke to Vascular and made them aware of situastion

## 2020-02-12 NOTE — ED Notes (Signed)
Pt's pain untouched by last dose of dilaudid. Pt is in pain 10 out of 10 on right lower abdomen. Pt states that pain is slightly reliev ed with pressure. Pt is diaphoretic  And nausaus. Spoke to Dr. Gaynell Face and paged Vascular

## 2020-02-12 NOTE — Consult Note (Addendum)
Hospital Consult    Reason for Consult:  Aortic dissection Requesting Physician:  Myrtis Ser MRN #:  [id number]  History of Present Illness: This is a 45 y.o. male who presented to the [facility] yesterday with complaints of abdominal pain.  Pt was transferred to [facility].  Pt states that he was diagnosed with covid a couple of weeks ago and has been home from work where he is employed by [employer] in [place].  He states that Saturday night, he ate a steak and potatoes and started having abdominal pain/heartburn/nausea and took his medicine for reflux.  He states that his pain did not improve.  He states if he lays on his back, his stomach hurts, if he lays on his stomach his back hurts.  He has had a hard time getting comfortable.  Pt states he had issues with hypertension in 2005 when he was incarcerated and took his meds until about 2010 at which time he stopped his meds bc he didn't think he needed them any longer.  He does not have any leg pain or chest pain.   The pt is not on a statin for cholesterol management.  The pt is not on a daily aspirin.   Other AC:  none The pt has listed CCB on med list for hypertension-pt states he has not been taking meds. The pt is not diabetic.   Tobacco hx:  current  Past Medical History:  Diagnosis Date   Acid reflux    WATCHES DIET/  TAKES TUMS PRN   Hypertension    Nocturia    Right hydrocele     Past Surgical History:  Procedure Laterality Date   HYDROCELE EXCISION Right 01/02/2013   Procedure: HYDROCELECTOMY ADULT;  Surgeon: Martina Sinner, MD;  Location: [facility];  Service: Urology;  Laterality: Right;    No Known Allergies  Prior to Admission medications   Medication Sig Start Date End Date Taking? Authorizing Provider  amLODipine (NORVASC) 5 MG tablet Take 5 mg by mouth daily.    [provider]  calcium carbonate (TUMS - DOSED IN MG ELEMENTAL CALCIUM) 500 MG chewable tablet  Chew 1 tablet by mouth as needed for heartburn.    [provider]  cephALEXin (KEFLEX) 250 MG capsule Take 1 capsule (250 mg total) by mouth 3 (three) times daily. 01/02/13   Alfredo Martinez, MD  HYDROcodone-acetaminophen (NORCO) 5-325 MG per tablet Take 1 tablet by mouth every 6 (six) hours as needed for pain. 01/02/13   Alfredo Martinez, MD  HYDROcodone-acetaminophen (NORCO/VICODIN) 5-325 MG per tablet Take 1 tablet by mouth every 6 (six) hours as needed for pain.    [provider]  ibuprofen (ADVIL,MOTRIN) 200 MG tablet Take 200 mg by mouth every 6 (six) hours as needed for pain.    [provider]    Social History   Socioeconomic History   Marital status: Single    Spouse name: Not on file   Number of children: Not on file   Years of education: Not on file   Highest education level: Not on file  Occupational History   Not on file  Tobacco Use   Smoking status: Current Every Day Smoker    Packs/day: 0.25    Years: 15.00    Pack years: 3.75    Types: Cigarettes   Smokeless tobacco: Never Used  Substance and Sexual Activity   Alcohol use: Yes    Alcohol/week: 2.0 standard drinks  Types: 2 Standard drinks or equivalent per week   Drug use: No   Sexual activity: Not on file  Other Topics Concern   Not on file  Social History Narrative   Not on file   Social Determinants of Health   Financial Resource Strain:    Difficulty of Paying Living Expenses: Not on file  Food Insecurity:    Worried About Running Out of Food in the Last Year: Not on file   Ran Out of Food in the Last Year: Not on file  Transportation Needs:    Lack of Transportation (Medical): Not on file   Lack of Transportation (Non-Medical): Not on file  Physical Activity:    Days of Exercise per Week: Not on file   Minutes of Exercise per Session: Not on file  Stress:    Feeling of Stress : Not on file  Social Connections:    Frequency of Communication  with Friends and Family: Not on file   Frequency of Social Gatherings with Friends and Family: Not on file   Attends Religious Services: Not on file   Active Member of Clubs or Organizations: Not on file   Attends Banker Meetings: Not on file   Marital Status: Not on file  Intimate Partner Violence:    Fear of Current or Ex-Partner: Not on file   Emotionally Abused: Not on file   Physically Abused: Not on file   Sexually Abused: Not on file     No family history on file.  ROS: [x]  Positive   [ ]  Negative   [ ]  All sytems reviewed and are negative  Cardiac: []  chest pain/pressure [x]  SOB []  DOE  Vascular: []  pain in legs while walking []  pain in legs at rest []  pain in legs at night []  non-healing ulcers  Pulmonary: []  asthma/wheezing []  home O2  Neurologic: []  weakness or numbness in []  legs []  hx of CVA []  mini stroke   Hematologic: []  hx of cancer  Endocrine:   []  diabetes []  thyroid disease  GI [x]  GERD   GU: []  CKD/renal failure []  HD--[]  M/W/F or []  T/T/S  Psychiatric: []  anxiety []  depression  Musculoskeletal: []  arthritis []  joint pain  Integumentary: []  rashes []  ulcers  Constitutional: []  fever  []  chills  Physical Examination  Vitals:   02/12/20 0945 02/12/20 0950  BP: (!) 156/84 (!) 145/81  Pulse: 90 94  Resp: (!) 25 (!) 28  Temp:    SpO2: 96% 95%   Body mass index is 24.41 kg/m.  General:  WDWN appears uncomfortable Gait: Not observed HENT: WNL, normocephalic Pulmonary: normal non-labored breathing Cardiac: regular Abdomen: tender to palpation Skin: without rashes Vascular Exam/Pulses:  Right Left  Radial 2+ (normal) 2+ (normal)  Femoral 2+ (normal) 2+ (normal)  DP 2+ (normal) 2+ (normal)  PT 2+ (normal) 2+ (normal)   Extremities: without ischemic changes, without Gangrene , without cellulitis; without open wounds;  Musculoskeletal: no muscle wasting or atrophy  Neurologic: A&O X 3;  No  focal weakness or paresthesias are detected; speech is fluent/normal Psychiatric:  The pt has Normal affect.  CBC    Component Value Date/Time   WBC 12.1 (H) 02/12/2020 0407   RBC 4.20 (L) 02/12/2020 0407   HGB 11.5 (L) 02/12/2020 0407   HCT 35.6 (L) 02/12/2020 0407   PLT 341 02/12/2020 0407   MCV 84.8 02/12/2020 0407   MCH 27.4 02/12/2020 0407   MCHC 32.3 02/12/2020 0407   RDW 15.9 (H)  02/12/2020 0407   LYMPHSABS 1.2 02/12/2020 0407   MONOABS 1.3 (H) 02/12/2020 0407   EOSABS 0.2 02/12/2020 0407   BASOSABS 0.1 02/12/2020 0407    BMET    Component Value Date/Time   NA 138 02/12/2020 0407   K 2.6 (LL) 02/12/2020 0407   CL 101 02/12/2020 0407   CO2 25 02/12/2020 0407   GLUCOSE 207 (H) 02/12/2020 0407   BUN 10 02/12/2020 0407   CREATININE 1.01 02/12/2020 0407   CALCIUM 8.5 (L) 02/12/2020 0407   GFRNONAA >60 02/12/2020 0407   GFRAA >60 02/12/2020 0407    COAGS: No results found for: INR, PROTIME   Non-Invasive Vascular Imaging:   CTA Chest/Abd/Pelvis 02/12/2020: IMPRESSION: 1. 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 2. There is retroperitoneal stranding about the aorta and iliacs which is presumably related to the dissection. The diffuseness of stranding suggests inflammation rather than leakage, but there is no aneurysm or clear aortitis. Recommend vascular surgery consultation and consideration of short term follow-up CT. 3. 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 4. Symmetric lumen enhancement in the arterial phase without evidence of visceral ischemia. 5. Premature atherosclerosis.   ASSESSMENT/PLAN: This is a 44 y.o. male with aortic dissection in setting of uncontrolled hypertension  -pt has palpable femoral pulses as well as bilateral pedal pulses and radial pulses.  No evidence of visceral ischemia or critical limb  ischemia. -pt will be admitted to ICU to critical care medicine for strict BP control and pain management.   -will keep npo at this time except for sips and chips of ice -will continue to follow   Doreatha Massed, PA-C [facility] [phone]   I have examined the patient, reviewed and agree with above.  Acute thoracic aortic dissection beginning probably Saturday night or Sunday.  CT scan reveals no evidence of aortic rupture.  Normal aortic diameter.  Does have  Dissection beginning at the left subclavian and extending through the iliac vessels.  There is some compromise of the lumen of the celiac artery.  Otherwise no evidence of decreased perfusion.  He does have some abdominal discomfort but no peritoneal signs.  No evidence of intestinal ischemia by CT scan.  No evidence of rupture or malperfusion that would require acute surgical intervention.  Patient to be admitted to the intensive care unit with critical care medicine.  Vascular will follow his progress.  I discussed this at length with the patient who understands initial treatment will focus on blood pressure and pain control  Gretta Began, MD 02/12/2020 12:52 PM

## 2020-02-12 NOTE — ED Provider Notes (Addendum)
Attestation: Medical screening examination/treatment/procedure(s) were conducted as a shared visit with non-physician practitioner(s) and myself.  I personally evaluated the patient during the encounter.   Briefly, the patient is a 44 y.o. male with h/o recent Covid infection, here for abdominal pain for the past several days.   Vitals:   02/12/20 0548 02/12/20 0627  BP: (!) 203/125 (!) 192/112  Pulse: 60 62  Resp: 18 16  Temp:    SpO2: 95% 99%    CONSTITUTIONAL: Nontoxic but ill-appearing, NAD NEURO:  Alert and oriented x 3, no focal deficits EYES:  pupils equal and reactive ENT/NECK:  trachea midline, no JVD CARDIO: Regular rate, regular rhythm, well-perfused PULM: None labored breathing GI/GU:  Abdomen non-distended, diffuse abdominal discomfort without focal tenderness MSK/SPINE:  No gross deformities, no edema SKIN:  no rash, atraumatic, numerous tattoos PSYCH:  Appropriate speech and behavior   EKG Interpretation  Date/Time:    Ventricular Rate:    PR Interval:    QRS Duration:   QT Interval:    QTC Calculation:   R Axis:     Text Interpretation:         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 Patient was hypertensive and required Cardene drip with ICU level care.  I personally discussed findings with patient who relayed that he was not able to stay at the moment and needed to be discharged so he can go gather his belongings. He reports being homeless and having to stay in a motel. He wanted the time to go gather his belongings and return. He reports that it should take no longer than 1 hour. He was informed that his condition was extremely serious and he required the above level of care. Patient expresses understanding and appreciation for our sincerity however he chose to be discharged. Given his elevated blood pressure, he was given a single dose of labetalol and instructed  to return to emergency department for admission and continued management of his dissection.  By this time I had already spoken with the ICU team and vascular surgery. We will need to reconsult them if/when patient returns.   AMA: Date: 02/12/2020 Patient: Joseph Dyer or his authorized caregiver has made the decision for the patient to leave the emergency department against the advice of No att. providers found. He or his authorized caregiver has been informed about any test results if available and understands the inherent risks of leaving, including permanent disability or death. He or his authorized caregiver has decided to accept the responsibility for this decision. He or his authorized caregiver is not intoxicated and has demonstrated decision making capacity. Joseph Dyer and all necessary parties have been advised that he may return for further evaluation or treatment at any time and they are recommended alternative follow up if continually unwilling. His condition at time of discharge was Serious.  Vital signs Blood pressure (!) 192/112, pulse 62, temperature 98.2 F (36.8 C), temperature source Oral, resp. rate 16, height 5\' 9"  (1.753 m), weight 81.6 kg, SpO2 99 %.     1-3 Lead EKG Interpretation Performed by: Marland Kitchen, MD Authorized by: Nira Conn, MD     Interpretation: normal     ECG rate:  92   ECG rate assessment: normal     Rhythm: sinus rhythm     Ectopy: none     Conduction: normal   .Critical Care Performed by:  Nira Conn, MD Authorized by: Nira Conn, MD    CRITICAL CARE Performed by: Amadeo Garnet Kimothy Kishimoto Total critical care time: 30 minutes Critical care time was exclusive of separately billable procedures and treating other patients. Critical care was necessary to treat or prevent imminent or life-threatening deterioration. Critical care was time spent personally by me on the following activities:  development of treatment plan with patient and/or surrogate as well as nursing, discussions with consultants, evaluation of patient's response to treatment, examination of patient, obtaining history from patient or surrogate, ordering and performing treatments and interventions, ordering and review of laboratory studies, ordering and review of radiographic studies, pulse oximetry and re-evaluation of patient's condition.      Nira Conn, MD 02/12/20 [phone]

## 2020-02-12 NOTE — H&P (Signed)
NAME:  Joseph Dyer, MRN:  [id number], DOB:  May 06, 1976, LOS: 0 ADMISSION DATE:  02/12/2020, CONSULTATION DATE:  02/12/20 REFERRING MD: Dr Myrtis Ser , CHIEF COMPLAINT:  Abdominal pain  Brief History   69 black male with abdominal pain to osh, left [facility] and returned requiring transfer to ICU at [facility]  History of present illness   44 yo male with pmh of untreated htn, presented to o[facility] with abdominal pain that was worsening. He states that it originally began Saturday evening after eating and was not relieved by any otc meds. Discomfort progressed and was worse when laying on his back however when laying on his abdomen his back hurt.   Pt denies any pmh stating he only took anti-htn meds when he was incarcerated but stopped them when he hleft. Denies any other complains  Past Medical History   Past Medical History:  Diagnosis Date  . Acid reflux    WATCHES DIET/  TAKES TUMS PRN  . Hypertension   . Nocturia   . Right hydrocele     Significant Hospital Events   Admitted to [facility] for AO dissection  Consults:  Vascular surgery 9/14  Procedures:  None  Significant Diagnostic Tests:  cta chest/abdomen/pelvis 9/14: 1. 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 2. There is retroperitoneal stranding about the aorta and iliacs which is presumably related to the dissection. The diffuseness of stranding suggests inflammation rather than leakage, but there is no aneurysm or clear aortitis. Recommend vascular surgery consultation and consideration of short term follow-up CT. 3. 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 4. Symmetric lumen enhancement in the arterial phase without evidence of visceral ischemia. 5. Premature atherosclerosis.   Micro Data:  8/30 sars2: POSITIVE  Antimicrobials:  none  Interim history/subjective:  9/14: seen in ed after transfer  from[facility]. Pt states abdominal pain continues.   Objective   Blood pressure (!) 141/92, pulse 78, temperature 98.5 F (36.9 C), temperature source Oral, resp. rate 20, height 6' (1.829 m), weight 81.6 kg, SpO2 99 %.       No intake or output data in the 24 hours ending 02/12/20 1243 Filed Weights   02/12/20 0814  Weight: 81.6 kg    Examination: General: well developed male reclining in bed appears uncomfortable HEENT: NCAT, EOMI, PERRLA, MMMP Lungs: CTA, no wheezes, rhonchi, rales Cardiovascular: RRR, no m/g/r Abdomen: soft, mildly ttp, no rebound +guarding, ND, BS+ Extremities: no edema Skin: no rashes, warm and dry Neuro: moves all 4 extremities GU: deffered Resolved Hospital Problem list     Assessment & Plan:  Acute type B Ao dissection:  -known long standing htn (came off meds some time ago) -cardene infusion for sbp >140 -monitor closely -appreciate vascular -pain control -will eventually need daily asa, cholesterol medication and anti-htn rx'd.  -serial LE pulse checks.   HTN:  -cardene infusion -will start po norvasc when able to take po  Hypokalemia:  -unsure if received replacement -recheck value -add magnesium -replace mag >2 and K >4 goals  Normocytic anemia:  -follow Best practice:  Diet: npo until surgical plan further delineated.  Pain/Anxiety/Delirium protocol (if indicated): prn dilaudid VAP protocol (if indicated): n/a DVT prophylaxis: scd GI prophylaxis: ppi Glucose control: monitoring Mobility: bedrest Code Status: FULL Family Communication: pt updated at length Disposition: ICU  Labs   CBC: Recent Labs  Lab 02/12/20 0407  WBC 12.1*  NEUTROABS 9.3*  HGB 11.5*  HCT 35.6*  MCV 84.8  PLT 341    Basic Metabolic Panel: Recent Labs  Lab 02/12/20 0407  NA 138  K 2.6*  CL 101  CO2 25  GLUCOSE 207*  BUN 10  CREATININE 1.01  CALCIUM 8.5*   GFR: Estimated Creatinine Clearance: 101.4 mL/min (by C-G formula based on SCr of  1.01 mg/dL). Recent Labs  Lab 02/12/20 0407  WBC 12.1*  LATICACIDVEN 1.6    Liver Function Tests: Recent Labs  Lab 02/12/20 0407  AST 29  ALT 18  ALKPHOS 49  BILITOT 1.0  PROT 6.8  ALBUMIN 3.4*   Recent Labs  Lab 02/12/20 0407  LIPASE 23   No results for input(s): AMMONIA in the last 168 hours.  ABG No results found for: PHART, PCO2ART, PO2ART, HCO3, TCO2, ACIDBASEDEF, O2SAT   Coagulation Profile: No results for input(s): INR, PROTIME in the last 168 hours.  Cardiac Enzymes: No results for input(s): CKTOTAL, CKMB, CKMBINDEX, TROPONINI in the last 168 hours.  HbA1C: No results found for: HGBA1C  CBG: No results for input(s): GLUCAP in the last 168 hours.  Review of Systems:   Abdominal pain decreased po intake, no relieving factors. Improvement in resp status over past few weeks (dx with covid 8/30). + nausea no vomiting no diarrhea. + low back pain as well.   Past Medical History  He,  has a past medical history of Acid reflux, Hypertension, Nocturia, and Right hydrocele.   Surgical History    Past Surgical History:  Procedure Laterality Date  . HYDROCELE EXCISION Right 01/02/2013   Procedure: HYDROCELECTOMY ADULT;  Surgeon: Martina Sinner, MD;  Location: [facility];  Service: Urology;  Laterality: Right;     Social History   reports that he has been smoking cigarettes. He has a 3.75 pack-year smoking history. He has never used smokeless tobacco. He reports current alcohol use of about 2.0 standard drinks of alcohol per week. He reports that he does not use drugs.   Family History   His family history is not on file.   Allergies No Known Allergies   Home Medications  Prior to Admission medications   Medication Sig Start Date End Date Taking? Authorizing Provider  amLODipine (NORVASC) 5 MG tablet Take 5 mg by mouth daily.    [provider]  calcium carbonate (TUMS - DOSED IN MG ELEMENTAL CALCIUM) 500 MG chewable tablet  Chew 1 tablet by mouth as needed for heartburn.    [provider]  cephALEXin (KEFLEX) 250 MG capsule Take 1 capsule (250 mg total) by mouth 3 (three) times daily. 01/02/13   Alfredo Martinez, MD  HYDROcodone-acetaminophen (NORCO) 5-325 MG per tablet Take 1 tablet by mouth every 6 (six) hours as needed for pain. 01/02/13   Alfredo Martinez, MD  HYDROcodone-acetaminophen (NORCO/VICODIN) 5-325 MG per tablet Take 1 tablet by mouth every 6 (six) hours as needed for pain.    [provider]  ibuprofen (ADVIL,MOTRIN) 200 MG tablet Take 200 mg by mouth every 6 (six) hours as needed for pain.    [provider]     Critical care time: The patient is critically ill with multiple organ systems failure and requires high complexity decision making for assessment and support, frequent evaluation and titration of therapies, application of advanced monitoring technologies and extensive interpretation of multiple databases.  Critical care time 39 mins. This represents my time independent of the NP's/PA's/med students/residents time taking care of the pt. This is  excluding procedures.     Briant Sites DO [facility] 02/12/2020, 12:43 PM

## 2020-02-12 NOTE — ED Notes (Signed)
ED Provider at bedside. 

## 2020-02-12 NOTE — Progress Notes (Signed)
Patient ID: Joseph Dyer, male   DOB: Apr 29, 1976, 44 y.o.   MRN: [id number] The patient is resting comfortably currently.  Denies any pain.  He is quite groggy from sedation from narcotic pain medication.  He has 2+ dorsalis pedis pulses bilaterally.  His abdomen is soft.  He does not have any rebound tenderness.  I shaking his hips he has no pain associated with this.  Blood pressure is 135 systolic  Stable overall.  No evidence of malperfusion.  Continue with pain control and hypertension control

## 2020-02-12 NOTE — ED Provider Notes (Signed)
[facility] EMERGENCY [facility]PART[facility] Provider Note   CSN: [id number] Arrival date & time: 02/12/20  [id number]     History Chief Complaint  Patient presents with  . Shortness of Breath    Joseph Dyer is a 44 y.o. male   HPI Patient is a 44 year old male past medical history of HTN and acid reflux.  Patient presents today as transfer from [facility].  He was seen there earlier today and diagnosed with a type B aortic dissection.   Patient is on Cardene 15mg /min.  With systolic blood pressures in the 140-150 range. I reviewed patient's lab work and imaging.  Patient appears to have a type B aortic dissection with extension to bilateral iliacs.    IMPRESSION: 1. 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 2. There is retroperitoneal stranding about the aorta and iliacs which is presumably related to the dissection. The diffuseness of stranding suggests inflammation rather than leakage, but there is no aneurysm or clear aortitis. Recommend vascular surgery consultation and consideration of short term follow-up CT. 3. 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 4. Symmetric lumen enhancement in the arterial phase without evidence of visceral ischemia. 5. Premature atherosclerosis    Past Medical History:  Diagnosis Date  . Acid reflux    WATCHES DIET/  TAKES TUMS PRN  . Hypertension   . Nocturia   . Right hydrocele     There are no problems to display for this patient.   Past Surgical History:  Procedure Laterality Date  . HYDROCELE EXCISION Right 01/02/2013   Procedure: HYDROCELECTOMY ADULT;  Surgeon: Martina SinnerScott A MacDiarmid, MD;  [facility] [place];  Service: Urology;  Laterality: Right;       No family history on file.  Social History   Tobacco Use  . Smoking status: Current Every Day Smoker     Packs/day: 0.25    Years: 15.00    Pack years: 3.75    Types: Cigarettes  . Smokeless tobacco: Never Used  Substance Use Topics  . Alcohol use: Yes    Alcohol/week: 2.0 standard drinks    Types: 2 Standard drinks or equivalent per week  . Drug use: No    Home Medications Prior to Admission medications   Medication Sig Start Date End Date Taking? Authorizing Provider  amLODipine (NORVASC) 5 MG tablet Take 5 mg by mouth daily.    [provider]  calcium carbonate (TUMS - DOSED IN MG ELEMENTAL CALCIUM) 500 MG chewable tablet Chew 1 tablet by mouth as needed for heartburn.    [provider]  cephALEXin (KEFLEX) 250 MG capsule Take 1 capsule (250 mg total) by mouth 3 (three) times daily. 01/02/13   Alfredo MartinezMacDiarmid, Scott, MD  HYDROcodone-acetaminophen (NORCO) 5-325 MG per tablet Take 1 tablet by mouth every 6 (six) hours as needed for pain. 01/02/13   Alfredo MartinezMacDiarmid, Scott, MD  HYDROcodone-acetaminophen (NORCO/VICODIN) 5-325 MG per tablet Take 1 tablet by mouth every 6 (six) hours as needed for pain.    [provider]  ibuprofen (ADVIL,MOTRIN) 200 MG tablet Take 200 mg by mouth every 6 (six) hours as needed for pain.    [provider]    Allergies    Patient has no known allergies.  Review of Systems    Review of Systems  Constitutional: Positive for fatigue. Negative for chills and fever.  HENT: Negative for congestion and rhinorrhea.  Respiratory: Positive for shortness of breath. Negative for cough.   Cardiovascular: Positive for chest pain. Negative for palpitations.  Gastrointestinal: Negative for diarrhea, nausea and vomiting.  Genitourinary: Negative for difficulty urinating and dysuria.  Musculoskeletal: Negative for arthralgias and back pain.  Skin: Negative for color change and rash.  Neurological: Negative for light-headedness and headaches.   Physical Exam Updated Vital Signs BP (!) 145/81   Pulse 94   Temp 98.5 F (36.9 C) (Oral)    Resp (!) 28   Ht 6' (1.829 m)   Wt 81.6 kg   SpO2 95%   BMI 24.41 kg/m   Physical Exam Vitals and nursing note reviewed.  Constitutional:      General: He is not in acute distress.    Appearance: Normal appearance. He is not ill-appearing.     Comments: Pleasant 44 year old male in no acute distress.  States his pain is very minimal at this time.  HENT:     Head: Normocephalic and atraumatic.  Eyes:     General: No scleral icterus.       Right eye: No discharge.        Left eye: No discharge.     Conjunctiva/sclera: Conjunctivae normal.  Cardiovascular:     Comments: Pulses 3+ BL upper extremities 2+ in RLE dp/pt; 3+ LLE dp/pt Pulmonary:     Effort: Pulmonary effort is normal.     Breath sounds: No stridor.  Skin:    General: Skin is warm and dry.  Neurological:     Mental Status: He is alert and oriented to person, place, and time. Mental status is at baseline.     ED Results / Procedures / Treatments   Labs (all labs ordered are listed, but only abnormal results are displayed) Labs Reviewed - No data to display  EKG None  Radiology DG Abd 1 View  Result Date: 02/12/2020 CLINICAL DATA:  Abdominal pain.  COVID pneumonia. EXAM: ABDOMEN - 1 VIEW COMPARISON:  None. FINDINGS: Normal bowel gas pattern. No excessive stool retention. There is a rounded calcified density over the right lower quadrant measuring 6 mm. Clear lung bases. L4-5 disc degeneration. IMPRESSION: 1. Normal bowel gas pattern. 2. Calcification over the right lower quadrant which could be appendicolith. Electronically Signed   By: Marnee Spring M.D.   On: 02/12/2020 04:30   CT ABDOMEN PELVIS W CONTRAST  Result Date: 02/12/2020 CLINICAL DATA:  Abdominal pain for 2 weeks.  COVID positive. EXAM: CT ABDOMEN AND PELVIS WITH CONTRAST TECHNIQUE: Multidetector CT imaging of the abdomen and pelvis was performed using the standard protocol following bolus administration of intravenous contrast. CONTRAST:   OMNIPAQUE IOHEXOL 300 MG/ML  SOLN COMPARISON:  None. FINDINGS: Lower chest: Minimal patchy density at the lung bases, there is subsequent chest CT and recent history of COVID-19. Hepatobiliary: No focal liver abnormality.No evidence of biliary obstruction or stone. Pancreas: 2.6 cm cystic density between the stomach and pancreatic tail. No evidence of acute pancreatitis. No visible complexity to the cyst. Spleen: Unremarkable. Adrenals/Urinary Tract: Negative adrenals. No hydronephrosis or stone. Unremarkable bladder. Stomach/Bowel: No focal inflammation or appendicitis . No bowel obstruction Vascular/Lymphatic: Aortic iliac dissection and retroperitoneal stranding. Compromised celiac axis. No mass or adenopathy. Reproductive:No pathologic findings. Other: No ascites or pneumoperitoneum. Musculoskeletal: L4-5 disc space collapse with endplate irregularity but degenerative and chronic appearing with vacuum phenomenon arguing against discitis. The retroperitoneal inflammatory changes also extend well beyond this disc space. Critical Value/emergent results were called by telephone at the time  of interpretation on 02/12/2020 at 5:50 am to provider Dr Eudelia Bunch, who verbally acknowledged these results. IMPRESSION: 1. 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 CTA was immediately performed after this study, please reference that report. 2. 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 Please correlate for history of pancreatitis. After convalescence enhanced MRI is recommended if there is no known pancreatitis history. 3. Premature atherosclerosis. Electronically Signed   By: Marnee Spring M.D.   On: 02/12/2020 05:51   DG Chest Portable 1 View  Result Date: 02/12/2020 CLINICAL DATA:  COVID short of breath EXAM: PORTABLE CHEST 1 VIEW COMPARISON:  01/28/2020 FINDINGS: Improved aeration bilaterally. No acute consolidation or effusion. Stable  cardiomediastinal silhouette. No pneumothorax. IMPRESSION: Improved aeration bilaterally. No focal pulmonary infiltrate. Electronically Signed   By: Jasmine Pang M.D.   On: 02/12/2020 01:39   CT Angio Chest/Abd/Pel for Dissection W and/or Wo Contrast  Result Date: 02/12/2020 CLINICAL DATA:  Thoracic aortic aneurysm suspected. EXAM: CT ANGIOGRAPHY CHEST, ABDOMEN AND PELVIS TECHNIQUE: Non-contrast CT of the chest was initially obtained. Multidetector CT imaging through the chest, abdomen and pelvis was performed using the standard protocol during bolus administration of intravenous contrast. Multiplanar reconstructed images and MIPs were obtained and reviewed to evaluate the vascular anatomy. CONTRAST:  OMNIPAQUE IOHEXOL 350 MG/ML SOLN COMPARISON:  None. FINDINGS: CTA CHEST FINDINGS Cardiovascular: A noncontrast phase shows no intramural hematoma in the aorta. Probable left ventricular hypertrophy. No pericardial effusion. No pulmonary artery filling defects. Aortic dissection with flap beginning at the isthmus. Relatively symmetric enhancement of the 2 lumens in the chest. No aortic aneurysm. Mediastinum/Nodes: Negative for hematoma or inflammatory change. Lungs/Pleura: Mild patchy reticular density correlating with recent COVID pneumonia. No honeycombing, effusion, or air leak. Musculoskeletal: Spondylosis. Review of the MIP images confirms the above findings. CTA ABDOMEN AND PELVIS FINDINGS VASCULAR Aorta: Aortic dissection with relatively symmetric enhancement of the lumens in the arterial phase, less so in the portal venous phase. There is atheromatous calcification and intimal thickening that is premature for age. No discrete enhancing thick wall based on the abdomen and pelvis CT, but there is generalized retroperitoneal stranding. Celiac: There is a separate origin of the left gastric which arises from the true lumen and is widely patent. The remaining celiac branches are truncated at the false  lumen, but still enhance due to reconstitution. Phrenic branches arise from the aorta and are patent. SMA: Arises from the true lumen. No downstream branch occlusion or stenosis is seen Renals: Single bilateral renal artery. Early branching on the left. No asymmetric enhancement or aneurysm seen IMA: Patent, from the false lumen Inflow: Dissection continues into the bilateral external iliac arteries, more extensive on the right where there is slightly less intense luminal enhancement. Veins: There is stranding around the iliac veins and lower IVC without vein expansion and negative history for leg swelling. Review of the MIP images confirms the above findings. NON-VASCULAR Nonvascular findings are described on preceding parenchymal phase abdomen and pelvis CT. Critical Value/emergent results were called by telephone at the time of interpretation on 02/12/2020 at 6:01 am to provider Dr Eudelia Bunch, who verbally acknowledged these results. Review of the MIP images confirms the above findings. IMPRESSION: 1. 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 2. There is retroperitoneal stranding about the aorta and iliacs which is presumably related to the dissection. The diffuseness of stranding suggests inflammation rather than leakage, but there is no  aneurysm or clear aortitis. Recommend vascular surgery consultation and consideration of short term follow-up CT. 3. 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 4. Symmetric lumen enhancement in the arterial phase without evidence of visceral ischemia. 5. Premature atherosclerosis. Electronically Signed   By: Marnee Spring M.D.   On: 02/12/2020 06:04    Procedures Procedures (including critical care time)  Medications Ordered in ED Medications  nicardipine (CARDENE) 20mg  in 0.86% saline IV infusion (0.1 mg/ml) (15 mg/hr Intravenous  Rate/Dose Change 02/12/20 02/14/20)    ED Course  I have reviewed the triage vital signs and the nursing notes.  Pertinent labs & imaging results that were available during my care of the patient were reviewed by me and considered in my medical decision making (see chart for details).  Patient arrived from [facility].  He has abdominal aortic dissection type B.  He was transferred on Cardene 15mg /min drip.  Blood pressures have been systolic 140-150.  He is mentating well.  States that his pain is very minimal.  Consult immediately placed to vascular surgery and critical care.  Clinical Course as of Feb 11 1110  Tue Feb 12, 2020  1051 Discussed with 07-06-2005 of Critical care. PCCM will see patient at bedside soon.    [WF]  1055 Discussed with vascular surgery who will assess patient at bedside.    [WF]    Clinical Course User Index [WF] Feb 14, 2020, PA   MDM Rules/Calculators/A&P                          Patient has been admitted to critical care service.  I discussed this case with my attending physician who cosigned this note including patient's presenting symptoms, physical exam, and planned diagnostics and interventions. Attending physician stated agreement with plan or made changes to plan which were implemented.   Attending physician assessed patient at bedside.  Patient is understanding of plan.  Vascular surgery has assessed patient.  Critical care will admit.  Final Clinical Impression(s) / ED Diagnoses Final diagnoses:  Dissection of aorta, unspecified portion of aorta [facility])    Rx / DC Orders ED Discharge Orders    None       Gailen Shelter, [facility] 02/12/20 1216    [place], MD 02/12/20 1514

## 2020-02-12 NOTE — ED Notes (Signed)
Dr Eudelia Bunch in room to speak with pt  Explained the results of his CT scan and that we would be admitting him into the hospital  Pt states he could not go to the hospital right now as he was living in a hotel and had to go get all of his stuff out so he did not loose it  Explained again the importance of being admitted and that if he left the ED he could potentially die  Pt asked to make a phone call and he would let us know his decision

## 2020-02-12 NOTE — Progress Notes (Signed)
Called by RN re: pt with new nausea vomiting and increasing abdominal pain relieved with pressure application that pt does to himself. BL LE pulses reportedly intact. sbp in 150's.   Advised him to call vascular to advise them as well esp in light of diaphoresis and abdominal pain.   Giving 3 of dilaudid, monitor BP and adjust cardene as needed to compensation and maintain sbp <140.   Ordered zofran as well.

## 2020-02-12 NOTE — ED Triage Notes (Signed)
C/o abd pain x 2 weeks , covid + 8/30 Dx covid pneumonia

## 2020-02-12 NOTE — ED Triage Notes (Signed)
Pt arrives back to ED ambulatory reports he was seen last night, has paperwork about a dissection reports he was told to come back to ED to be admitted.

## 2020-02-12 NOTE — Discharge Instructions (Signed)
Please return to an Emergency Dept as soon as possible as your require ICU level of care for your condition.    Date: 02/12/2020 Patient: Joseph Dyer or his authorized caregiver has made the decision for the patient to leave the emergency department against the advice of Shatasia Cutshaw, Amadeo Garnet, *. He or his authorized caregiver has been informed about any test results if available and understands the inherent risks of leaving, including permanent disability or death. He or his authorized caregiver has decided to accept the responsibility for this decision. He or his authorized caregiver is not intoxicated and has demonstrated decision making capacity. Jim Like and all necessary parties have been advised that he may return for further evaluation or treatment at any time and they are recommended alternative follow up if continually unwilling. His condition at time of discharge was serious.  Vital signs Blood pressure (!) 203/125, pulse 60, temperature 98.2 F (36.8 C), temperature source Oral, resp. rate 18, height 5\' 9"  (1.753 m), weight 81.6 kg, SpO2 95 %.

## 2020-02-13 ENCOUNTER — Inpatient Hospital Stay (HOSPITAL_COMMUNITY): Payer: BC Managed Care – PPO

## 2020-02-13 LAB — COMPREHENSIVE METABOLIC PANEL
ALT: 38 U/L (ref 0–44)
AST: 40 U/L (ref 15–41)
Albumin: 3.1 g/dL — ABNORMAL LOW (ref 3.5–5.0)
Alkaline Phosphatase: 77 U/L (ref 38–126)
Anion gap: 8 (ref 5–15)
BUN: 6 mg/dL (ref 6–20)
CO2: 28 mmol/L (ref 22–32)
Calcium: 8.7 mg/dL — ABNORMAL LOW (ref 8.9–10.3)
Chloride: 104 mmol/L (ref 98–111)
Creatinine, Ser: 0.9 mg/dL (ref 0.61–1.24)
GFR calc Af Amer: >60 mL/min (ref >60)
GFR calc non Af Amer: >60 mL/min (ref >60)
Glucose, Bld: 121 mg/dL — ABNORMAL HIGH (ref 70–99)
Potassium: 3.2 mmol/L — ABNORMAL LOW (ref 3.5–5.1)
Sodium: 140 mmol/L (ref 135–145)
Total Bilirubin: 0.8 mg/dL (ref 0.3–1.2)
Total Protein: 6.9 g/dL (ref 6.5–8.1)

## 2020-02-13 LAB — LACTIC ACID, PLASMA: Lactic Acid, Venous: 1 mmol/L (ref 0.5–1.9)

## 2020-02-13 LAB — MAGNESIUM: Magnesium: 2.1 mg/dL (ref 1.7–2.4)

## 2020-02-13 LAB — CBC
HCT: 38.4 % — ABNORMAL LOW (ref 39.0–52.0)
Hemoglobin: 12.1 g/dL — ABNORMAL LOW (ref 13.0–17.0)
MCH: 27.4 pg (ref 26.0–34.0)
MCHC: 31.5 g/dL (ref 30.0–36.0)
MCV: 86.9 fL (ref 80.0–100.0)
Platelets: 413 10*3/uL — ABNORMAL HIGH (ref 150–400)
RBC: 4.42 MIL/uL (ref 4.22–5.81)
RDW: 16 % — ABNORMAL HIGH (ref 11.5–15.5)
WBC: 11.4 10*3/uL — ABNORMAL HIGH (ref 4.0–10.5)
nRBC: 0 % (ref 0.0–0.2)

## 2020-02-13 MED ORDER — METOPROLOL TARTRATE 25 MG PO TABS
25.0000 mg | ORAL_TABLET | Freq: Two times a day (BID) | ORAL | Status: DC
  Administered 2020-02-13 – 2020-02-14 (×3): 25 mg via ORAL
  Filled 2020-02-13 (×3): qty 1

## 2020-02-13 MED ORDER — POTASSIUM CHLORIDE 10 MEQ/100ML IV SOLN
10.0000 meq | INTRAVENOUS | Status: AC
  Administered 2020-02-13 (×6): 10 meq via INTRAVENOUS
  Filled 2020-02-13 (×6): qty 100

## 2020-02-13 MED ORDER — ORAL CARE MOUTH RINSE
15.0000 mL | Freq: Two times a day (BID) | OROMUCOSAL | Status: DC
  Administered 2020-02-13 – 2020-02-14 (×5): 15 mL via OROMUCOSAL

## 2020-02-13 MED ORDER — HYDRALAZINE HCL 20 MG/ML IJ SOLN
5.0000 mg | Freq: Once | INTRAMUSCULAR | Status: AC
  Administered 2020-02-13: 5 mg via INTRAVENOUS
  Filled 2020-02-13: qty 1

## 2020-02-13 MED ORDER — PNEUMOCOCCAL VAC POLYVALENT 25 MCG/0.5ML IJ INJ: 0.5000 mL | INJECTION | INTRAMUSCULAR | Status: DC

## 2020-02-13 MED ORDER — AMLODIPINE BESYLATE 10 MG PO TABS
10.0000 mg | ORAL_TABLET | Freq: Every day | ORAL | Status: DC
  Administered 2020-02-13 – 2020-02-17 (×5): 10 mg via ORAL
  Filled 2020-02-13 (×5): qty 1

## 2020-02-13 MED ORDER — HYDROMORPHONE HCL 1 MG/ML IJ SOLN
2.0000 mg | INTRAMUSCULAR | Status: DC | PRN
  Administered 2020-02-13 – 2020-02-14 (×10): 2 mg via INTRAVENOUS
  Filled 2020-02-13 (×10): qty 2

## 2020-02-13 MED ORDER — INFLUENZA VAC SPLIT QUAD 0.5 ML IM SUSY: 0.5000 mL | PREFILLED_SYRINGE | INTRAMUSCULAR | Status: DC

## 2020-02-13 MED FILL — Nicardipine HCl IV Soln 20 MG/200ML in Sodium Chloride 0.9%: INTRAVENOUS | Qty: 200 | Status: AC

## 2020-02-13 NOTE — Progress Notes (Addendum)
[facility]  Subjective  - Decreased abdominal pain right now, but he states that if he does not receive the Dilaudid his pain returns and is sever.    Objective (!) 145/75 86 98.4 F (36.9 C) (Oral) (!) 23 95%  Intake/Output Summary (Last 24 hours) at 02/13/2020 0812 Last data filed at 02/13/2020 0600 Gross per 24 hour  Intake 1821.15 ml  Output 1500 ml  Net 321.15 ml    Abdomin soft without rebound pain Palpable pedal pulses. Heart RRR BP stable systolic 130-145 currently   Assessment/Planning: Aortic dissection from the isthmus to the bilateral external iliacs.  Continue BP control with Cardne drip @ 15 mg/ hr and Metoprolol 25 mg BID.   Pain control hopefully requiring less IV pain medication with time.  Will repeat CTA in the near future pending DR. Chad Donoghue's assessment and plan.  Mosetta Pigeon 02/13/2020 8:12 AM --  Laboratory Lab Results: Recent Labs    02/12/20 0407 02/13/20 0044  WBC 12.1* 11.4*  HGB 11.5* 12.1*  HCT 35.6* 38.4*  PLT 341 413*   BMET Recent Labs    02/12/20 1153 02/13/20 0044  NA 138 140  K 3.2* 3.2*  CL 104 104  CO2 24 28  GLUCOSE 90 121*  BUN 6 6  CREATININE 0.91 0.90  CALCIUM 8.4* 8.7*    COAG Lab Results  Component Value Date   INR 1.1 02/12/2020   No results found for: PTT   I have examined the patient, reviewed and agree with above.  Comfortable.  Had spaghetti for lunch and "knocked it out".  Abdomen benign.  Pain well controlled and blood pressure well controlled.  Gretta Began, MD 02/13/2020 4:37 PM

## 2020-02-13 NOTE — Progress Notes (Signed)
[facility] ADULT ICU REPLACEMENT PROTOCOL   The patient does apply for the [facility] Adult ICU Electrolyte Replacment Protocol based on the criteria listed below:   1. Is GFR >/= 30 ml/min? Yes.    Patient's GFR today is >60 2. Is SCr </= 2? Yes.   Patient's SCr is 0.9  ml/kg/hr 3. Did SCr increase >/= 0.5 in 24 hours? No. 4. Abnormal electrolyte(s): k 3.2 5. Ordered repletion with: protocol 6. If a panic level lab has been reported, has the CCM MD in charge been notified? No..   Physician:    Markus Daft A 02/13/2020 1:55 AM

## 2020-02-13 NOTE — Progress Notes (Signed)
eLink Physician-Brief Progress Note Patient Name: Joseph Dyer DOB: 1976/03/19 MRN: [id number]   Date of Service  02/13/2020  HPI/Events of Note  BP 148/76  Goal SBP< 140 d/t type b dissection.   maxed on cardene @ 15. Should we add PRN??   eICU Interventions  NPO.  Low dose Hydralazine IV once for now.      Intervention Category Intermediate Interventions: Hypertension - evaluation and management  Ranee Gosselin 02/13/2020, 6:57 AM

## 2020-02-13 NOTE — Progress Notes (Signed)
NAME:  Joseph Dyer, MRN:  [id number], DOB:  09-30-1975, LOS: 1 ADMISSION DATE:  02/12/2020, CONSULTATION DATE:  02/12/20 REFERRING MD: Dr Myrtis Ser , CHIEF COMPLAINT:  Abdominal pain  Brief History   51 black male with abdominal pain to osh, left ama and returned requiring transfer to ICU at [facility]  History of present illness   44 yo male with pmh of untreated htn, presented to osh with abdominal pain that was worsening. He states that it originally began Saturday evening after eating and was not relieved by any otc meds. Discomfort progressed and was worse when laying on his back however when laying on his abdomen his back hurt.   Pt denies any pmh stating he only took anti-htn meds when he was incarcerated but stopped them when he hleft. Denies any other complains  Past Medical History   Past Medical History:  Diagnosis Date  . Acid reflux    WATCHES DIET/  TAKES TUMS PRN  . Hypertension   . Nocturia   . Right hydrocele     Significant Hospital Events   Admitted to [facility] for AO dissection  Consults:  Vascular surgery 9/14  Procedures:  None  Significant Diagnostic Tests:  cta chest/abdomen/pelvis 9/14: 1. 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 2. There is retroperitoneal stranding about the aorta and iliacs which is presumably related to the dissection. The diffuseness of stranding suggests inflammation rather than leakage, but there is no aneurysm or clear aortitis. Recommend vascular surgery consultation and consideration of short term follow-up CT. 3. 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 4. Symmetric lumen enhancement in the arterial phase without evidence of visceral ischemia. 5. Premature atherosclerosis.   Micro Data:  8/30 sars2: POSITIVE  Antimicrobials:  none  Interim history/subjective:  9/15: remains on cardene infusion at  this time for goal sbp <140. Will start orals, will also start diet if ok with vascular 9/14: seen in ed after transfer fromosh. Pt states abdominal pain continues.   Objective   Blood pressure (!) 145/75, pulse 86, temperature 98.4 F (36.9 C), temperature source Oral, resp. rate (!) 23, height 6' (1.829 m), weight 86.6 kg, SpO2 95 %.        Intake/Output Summary (Last 24 hours) at 02/13/2020 0750 Last data filed at 02/13/2020 0600 Gross per 24 hour  Intake 1821.15 ml  Output 1500 ml  Net 321.15 ml   Filed Weights   02/12/20 0814 02/13/20 0500  Weight: 81.6 kg 86.6 kg    Examination: General: well developed male reclining in bed in no acute distress HEENT: NCAT, EOMI, PERRLA, MMMP Lungs: CTA, no wheezes, rhonchi, rales Cardiovascular: RRR, no m/g/r Abdomen: soft, mildly ttp, no rebound, noguarding, ND, BS+ Extremities: no edema Skin: no rashes, warm and dry Neuro: moves all 4 extremities GU: deferred Resolved Hospital Problem list     Assessment & Plan:  Acute type B Ao dissection:  -known long standing htn (came off meds some time ago) -cardene infusion for sbp >140 -adding norvasc and metoprolol -monitor closely -appreciate vascular -pain control -will eventually need daily asa, cholesterol medication and anti-htn rx'd.  -serial LE pulse checks.   HTN:  -cardene infusion -started oral anti-htn agents  Hypokalemia:  -unsure if received replacement -recheck value -add magnesium -replace mag >2 and K >4 goals  Normocytic anemia:  -follow Best practice:  Diet: npo until surgical plan further delineated.  Pain/Anxiety/Delirium  protocol (if indicated): prn dilaudid VAP protocol (if indicated): n/a DVT prophylaxis: scd GI prophylaxis: ppi Glucose control: monitoring Mobility: bedrest Code Status: FULL Family Communication: pt updated at length Disposition: ICU  Labs   CBC: Recent Labs  Lab 02/12/20 0407 02/13/20 0044  WBC 12.1* 11.4*  NEUTROABS  9.3*  --   HGB 11.5* 12.1*  HCT 35.6* 38.4*  MCV 84.8 86.9  PLT 341 413*    Basic Metabolic Panel: Recent Labs  Lab 02/12/20 0407 02/12/20 1153 02/13/20 0044  NA 138 138 140  K 2.6* 3.2* 3.2*  CL 101 104 104  CO2 25 24 28   GLUCOSE 207* 90 121*  BUN 10 6 6   CREATININE 1.01 0.91 0.90  CALCIUM 8.5* 8.4* 8.7*  MG  --  2.1 2.1   GFR: Estimated Creatinine Clearance: 113.8 mL/min (by C-G formula based on SCr of 0.9 mg/dL). Recent Labs  Lab 02/12/20 0407 02/13/20 0044  WBC 12.1* 11.4*  LATICACIDVEN 1.6 1.0    Liver Function Tests: Recent Labs  Lab 02/12/20 0407 02/13/20 0044  AST 29 40  ALT 18 38  ALKPHOS 49 77  BILITOT 1.0 0.8  PROT 6.8 6.9  ALBUMIN 3.4* 3.1*   Recent Labs  Lab 02/12/20 0407  LIPASE 23   No results for input(s): AMMONIA in the last 168 hours.  ABG No results found for: PHART, PCO2ART, PO2ART, HCO3, TCO2, ACIDBASEDEF, O2SAT   Coagulation Profile: Recent Labs  Lab 02/12/20 1153  INR 1.1    Cardiac Enzymes: No results for input(s): CKTOTAL, CKMB, CKMBINDEX, TROPONINI in the last 168 hours.  HbA1C: No results found for: HGBA1C  CBG: No results for input(s): GLUCAP in the last 168 hours.    Critical care time: The patient is critically ill with multiple organ systems failure and requires high complexity decision making for assessment and support, frequent evaluation and titration of therapies, application of advanced monitoring technologies and extensive interpretation of multiple databases.  Critical care time 32 mins. This represents my time independent of the NP's/PA's/med students/residents time taking care of the pt. This is excluding procedures.     02/14/20 DO [facility] 02/13/2020, 7:50 AM

## 2020-02-13 NOTE — Progress Notes (Signed)
Dr Daiva Eves paged in regards to patients isolation status with the recent Covid + test on 01/28/2020. Per the [facility] virus isolation algorithm because the patient had a positive test greater than 10 days ago and is not currently showing symptoms the patient does NOT need to be placed on isolation.

## 2020-02-14 LAB — CBC
HCT: 37 % — ABNORMAL LOW (ref 39.0–52.0)
Hemoglobin: 11.6 g/dL — ABNORMAL LOW (ref 13.0–17.0)
MCH: 26.7 pg (ref 26.0–34.0)
MCHC: 31.4 g/dL (ref 30.0–36.0)
MCV: 85.3 fL (ref 80.0–100.0)
Platelets: 390 10*3/uL (ref 150–400)
RBC: 4.34 MIL/uL (ref 4.22–5.81)
RDW: 15.8 % — ABNORMAL HIGH (ref 11.5–15.5)
WBC: 11.5 10*3/uL — ABNORMAL HIGH (ref 4.0–10.5)
nRBC: 0 % (ref 0.0–0.2)

## 2020-02-14 LAB — COMPREHENSIVE METABOLIC PANEL
ALT: 35 U/L (ref 0–44)
AST: 49 U/L — ABNORMAL HIGH (ref 15–41)
Albumin: 2.8 g/dL — ABNORMAL LOW (ref 3.5–5.0)
Alkaline Phosphatase: 86 U/L (ref 38–126)
Anion gap: 10 (ref 5–15)
BUN: 8 mg/dL (ref 6–20)
CO2: 25 mmol/L (ref 22–32)
Calcium: 8.5 mg/dL — ABNORMAL LOW (ref 8.9–10.3)
Chloride: 101 mmol/L (ref 98–111)
Creatinine, Ser: 0.93 mg/dL (ref 0.61–1.24)
GFR calc Af Amer: >60 mL/min (ref >60)
GFR calc non Af Amer: >60 mL/min (ref >60)
Glucose, Bld: 143 mg/dL — ABNORMAL HIGH (ref 70–99)
Potassium: 3.3 mmol/L — ABNORMAL LOW (ref 3.5–5.1)
Sodium: 136 mmol/L (ref 135–145)
Total Bilirubin: 0.8 mg/dL (ref 0.3–1.2)
Total Protein: 6.3 g/dL — ABNORMAL LOW (ref 6.5–8.1)

## 2020-02-14 LAB — MAGNESIUM: Magnesium: 1.9 mg/dL (ref 1.7–2.4)

## 2020-02-14 MED ORDER — HYDROMORPHONE HCL 1 MG/ML IJ SOLN: 1.0000 mg | INTRAMUSCULAR | Status: DC | PRN

## 2020-02-14 MED ORDER — METOPROLOL TARTRATE 50 MG PO TABS
50.0000 mg | ORAL_TABLET | Freq: Two times a day (BID) | ORAL | Status: DC
  Administered 2020-02-14 – 2020-02-15 (×2): 50 mg via ORAL
  Filled 2020-02-14 (×2): qty 1

## 2020-02-14 MED ORDER — MELATONIN 3 MG PO TABS
3.0000 mg | ORAL_TABLET | Freq: Every evening | ORAL | Status: DC | PRN
  Administered 2020-02-14 – 2020-02-16 (×3): 3 mg via ORAL
  Filled 2020-02-14 (×3): qty 1

## 2020-02-14 MED ORDER — OXYCODONE HCL 5 MG PO TABS
10.0000 mg | ORAL_TABLET | ORAL | Status: DC | PRN
  Administered 2020-02-14 – 2020-02-17 (×16): 10 mg via ORAL
  Filled 2020-02-14 (×16): qty 2

## 2020-02-14 MED ORDER — POTASSIUM CHLORIDE CRYS ER 20 MEQ PO TBCR
20.0000 meq | EXTENDED_RELEASE_TABLET | ORAL | Status: AC
  Administered 2020-02-14 (×2): 20 meq via ORAL
  Filled 2020-02-14 (×2): qty 1

## 2020-02-14 MED ORDER — METOPROLOL TARTRATE 25 MG PO TABS
25.0000 mg | ORAL_TABLET | Freq: Once | ORAL | Status: AC
  Administered 2020-02-14: 25 mg via ORAL
  Filled 2020-02-14: qty 1

## 2020-02-14 MED ORDER — LISINOPRIL 20 MG PO TABS
20.0000 mg | ORAL_TABLET | Freq: Every day | ORAL | Status: DC
  Administered 2020-02-14 – 2020-02-17 (×4): 20 mg via ORAL
  Filled 2020-02-14 (×4): qty 1

## 2020-02-14 MED ORDER — MAGNESIUM SULFATE 2 GM/50ML IV SOLN
2.0000 g | Freq: Once | INTRAVENOUS | Status: AC
  Administered 2020-02-14: 2 g via INTRAVENOUS
  Filled 2020-02-14: qty 50

## 2020-02-14 MED ORDER — POTASSIUM CHLORIDE 10 MEQ/100ML IV SOLN
10.0000 meq | INTRAVENOUS | Status: AC
  Administered 2020-02-14 (×4): 10 meq via INTRAVENOUS
  Filled 2020-02-14 (×4): qty 100

## 2020-02-14 NOTE — Progress Notes (Signed)
eLink called regarding patients complaint of 10/10 abdomen pain despite recent administration of 2 mg dilaudid.

## 2020-02-14 NOTE — Progress Notes (Signed)
eLink Physician-Brief Progress Note Patient Name: Reinaldo Helt DOB: 1975/11/27 MRN: [id number]   Date of Service  02/14/2020  HPI/Events of Note  Patient requesting a sleep aid.  eICU Interventions  Melatonin 3 mg Q HS PRN ordered.        Thomasene Lot Chrystopher Stangl 02/14/2020, 10:25 PM

## 2020-02-14 NOTE — Progress Notes (Signed)
eLink called regarding a nighttime sleep aide

## 2020-02-14 NOTE — Progress Notes (Addendum)
  Progress Note    02/14/2020 10:03 AM  Subjective:  abd pain stable per patient   Vitals:   02/14/20 0900 02/14/20 0935  BP: 134/72 132/74  Pulse: 95 92  Resp: (!) 25 17  Temp:    SpO2: 93% 96%   Physical Exam: Lungs:  Non labored Extremities:  Palpable DP pulses L > R Abdomen:  Soft Neurologic: A&O  CBC    Component Value Date/Time   WBC 11.5 (H) 02/14/2020 0239   RBC 4.34 02/14/2020 0239   HGB 11.6 (L) 02/14/2020 0239   HCT 37.0 (L) 02/14/2020 0239   PLT 390 02/14/2020 0239   MCV 85.3 02/14/2020 0239   MCH 26.7 02/14/2020 0239   MCHC 31.4 02/14/2020 0239   RDW 15.8 (H) 02/14/2020 0239   LYMPHSABS 1.2 02/12/2020 0407   MONOABS 1.3 (H) 02/12/2020 0407   EOSABS 0.2 02/12/2020 0407   BASOSABS 0.1 02/12/2020 0407    BMET    Component Value Date/Time   NA 136 02/14/2020 0239   K 3.3 (L) 02/14/2020 0239   CL 101 02/14/2020 0239   CO2 25 02/14/2020 0239   GLUCOSE 143 (H) 02/14/2020 0239   BUN 8 02/14/2020 0239   CREATININE 0.93 02/14/2020 0239   CALCIUM 8.5 (L) 02/14/2020 0239   GFRNONAA >60 02/14/2020 0239   GFRAA >60 02/14/2020 0239    INR    Component Value Date/Time   INR 1.1 02/12/2020 1153     Intake/Output Summary (Last 24 hours) at 02/14/2020 1003 Last data filed at 02/14/2020 0900 Gross per 24 hour  Intake 4680.22 ml  Output 3525 ml  Net 1155.22 ml     Assessment/Plan:  44 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 Will plan to repeat CTA at one month short of any further issues in the  hospital.  Apolinar Junes C. Randie Heinz, MD [facility] [place] Office: [phone] Pager: [phone]

## 2020-02-14 NOTE — Progress Notes (Signed)
eLink called regarding hypertension despite max dosages of Cardene. Message taken, awaiting call back at this time.

## 2020-02-14 NOTE — Progress Notes (Signed)
NAME:  Joseph Dyer, MRN:  [id number], DOB:  12-06-1975, LOS: 2 ADMISSION DATE:  02/12/2020, CONSULTATION DATE:  02/12/20 REFERRING MD: Dr Myrtis Ser , CHIEF COMPLAINT:  Abdominal pain  Brief History   68 black male with abdominal pain to osh, left ama and returned requiring transfer to ICU at [facility]  History of present illness   44 yo male with pmh of untreated htn, presented to osh with abdominal pain that was worsening. He states that it originally began Saturday evening after eating and was not relieved by any otc meds. Discomfort progressed and was worse when laying on his back however when laying on his abdomen his back hurt.   Pt denies any pmh stating he only took anti-htn meds when he was incarcerated but stopped them when he hleft. Denies any other complains  Past Medical History   Past Medical History:  Diagnosis Date  . Acid reflux    WATCHES DIET/  TAKES TUMS PRN  . Hypertension   . Nocturia   . Right hydrocele     Significant Hospital Events   Admitted to [facility] for AO dissection  Consults:  Vascular surgery 9/14  Procedures:  None  Significant Diagnostic Tests:  cta chest/abdomen/pelvis 9/14: 1. 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 2. There is retroperitoneal stranding about the aorta and iliacs which is presumably related to the dissection. The diffuseness of stranding suggests inflammation rather than leakage, but there is no aneurysm or clear aortitis. Recommend vascular surgery consultation and consideration of short term follow-up CT. 3. 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 4. Symmetric lumen enhancement in the arterial phase without evidence of visceral ischemia. 5. Premature atherosclerosis.   Micro Data:  8/30 sars2: POSITIVE  Antimicrobials:  none  Interim history/subjective:  9/16: remains on cardene infusion at  this time for goal sbp <140. Increased metoprolol (tachy to 110 as well), added lisinopril and maintained norvasc. If addition of acei does not improve BP to wean off cardene would rec transition to coreg from metoprolol. Abdominal pain improved but still req iv pain meds. Will transition to PO today (with decrease of dosage and frequency of IV) to help with d/c planning at sometime.  9/15: remains on cardene infusion at this time for goal sbp <140. Will start orals, will also start diet if ok with vascular 9/14: seen in ed after transfer fromosh. Pt states abdominal pain continues.   Objective   Blood pressure 132/74, pulse 92, temperature 97.7 F (36.5 C), temperature source Oral, resp. rate 17, height 6' (1.829 m), weight 86.6 kg, SpO2 96 %.        Intake/Output Summary (Last 24 hours) at 02/14/2020 1055 Last data filed at 02/14/2020 0900 Gross per 24 hour  Intake 4680.22 ml  Output 3525 ml  Net 1155.22 ml   Filed Weights   02/12/20 0814 02/13/20 0500  Weight: 81.6 kg 86.6 kg    Examination: General: well developed male reclining in bed in no acute distress HEENT: NCAT, EOMI, PERRLA, MMMP Lungs: CTA, no wheezes, rhonchi, rales Cardiovascular: RRR, no m/g/r Abdomen: soft, mildly ttp, no rebound, noguarding, ND, BS+ Extremities: no edema Skin: no rashes, warm and dry Neuro: moves all 4 extremities GU: deferred Resolved Hospital Problem list     Assessment & Plan:  Acute type B Ao dissection:  -known long standing htn (came off meds some time ago) -cardene infusion for sbp >  140 -contnorvasc and increase metoprolol -add lisinopril -may need transition to coreg from metoprolol if addition of acei does not help.  -monitor closely -appreciate vascular.. repeat cta in 1 month unless change in clinical status  -pain control -will eventually need daily asa, cholesterol medication and anti-htn rx'd.  -serial LE pulse checks.   HTN:  -cardene infusion -titration of oral  anti-htn agents  Hypokalemia:  hypomag: -replace -replace mag >2 and K >4 goals   Normocytic anemia:  -follow Best practice:  Diet:reg diet Pain/Anxiety/Delirium protocol (if indicated): prn dilaudid, added prn oxy to utilize prior to iv VAP protocol (if indicated): n/a DVT prophylaxis: scd GI prophylaxis: ppi Glucose control: monitoring Mobility: bedrest Code Status: FULL Family Communication: pt updated at length Disposition: ICU  Labs   CBC: Recent Labs  Lab 02/12/20 0407 02/13/20 0044 02/14/20 0239  WBC 12.1* 11.4* 11.5*  NEUTROABS 9.3*  --   --   HGB 11.5* 12.1* 11.6*  HCT 35.6* 38.4* 37.0*  MCV 84.8 86.9 85.3  PLT 341 413* 390    Basic Metabolic Panel: Recent Labs  Lab 02/12/20 0407 02/12/20 1153 02/13/20 0044 02/14/20 0239  NA 138 138 140 136  K 2.6* 3.2* 3.2* 3.3*  CL 101 104 104 101  CO2 25 24 28 25   GLUCOSE 207* 90 121* 143*  BUN 10 6 6 8   CREATININE 1.01 0.91 0.90 0.93  CALCIUM 8.5* 8.4* 8.7* 8.5*  MG  --  2.1 2.1 1.9   GFR: Estimated Creatinine Clearance: 110.1 mL/min (by C-G formula based on SCr of 0.93 mg/dL). Recent Labs  Lab 02/12/20 0407 02/13/20 0044 02/14/20 0239  WBC 12.1* 11.4* 11.5*  LATICACIDVEN 1.6 1.0  --     Liver Function Tests: Recent Labs  Lab 02/12/20 0407 02/13/20 0044 02/14/20 0239  AST 29 40 49*  ALT 18 38 35  ALKPHOS 49 77 86  BILITOT 1.0 0.8 0.8  PROT 6.8 6.9 6.3*  ALBUMIN 3.4* 3.1* 2.8*   Recent Labs  Lab 02/12/20 0407  LIPASE 23   No results for input(s): AMMONIA in the last 168 hours.  ABG No results found for: PHART, PCO2ART, PO2ART, HCO3, TCO2, ACIDBASEDEF, O2SAT   Coagulation Profile: Recent Labs  Lab 02/12/20 1153  INR 1.1    Cardiac Enzymes: No results for input(s): CKTOTAL, CKMB, CKMBINDEX, TROPONINI in the last 168 hours.  HbA1C: No results found for: HGBA1C  CBG: No results for input(s): GLUCAP in the last 168 hours.    Critical care time: The patient is critically  ill with multiple organ systems failure and requires high complexity decision making for assessment and support, frequent evaluation and titration of therapies, application of advanced monitoring technologies and extensive interpretation of multiple databases.  Critical care time 31 mins. This represents my time independent of the NP's/PA's/med students/residents time taking care of the pt. This is excluding procedures.     02/14/20 DO [facility] 02/14/2020, 10:55 AM

## 2020-02-15 DIAGNOSIS — I7103 Dissection of thoracoabdominal aorta: Secondary | ICD-10-CM

## 2020-02-15 DIAGNOSIS — I158 Other secondary hypertension: Secondary | ICD-10-CM

## 2020-02-15 LAB — CBC
HCT: 35.2 % — ABNORMAL LOW (ref 39.0–52.0)
Hemoglobin: 10.9 g/dL — ABNORMAL LOW (ref 13.0–17.0)
MCH: 26.6 pg (ref 26.0–34.0)
MCHC: 31 g/dL (ref 30.0–36.0)
MCV: 85.9 fL (ref 80.0–100.0)
Platelets: 365 10*3/uL (ref 150–400)
RBC: 4.1 MIL/uL — ABNORMAL LOW (ref 4.22–5.81)
RDW: 15.7 % — ABNORMAL HIGH (ref 11.5–15.5)
WBC: 8.8 10*3/uL (ref 4.0–10.5)
nRBC: 0 % (ref 0.0–0.2)

## 2020-02-15 LAB — COMPREHENSIVE METABOLIC PANEL
ALT: 46 U/L — ABNORMAL HIGH (ref 0–44)
AST: 38 U/L (ref 15–41)
Albumin: 2.8 g/dL — ABNORMAL LOW (ref 3.5–5.0)
Alkaline Phosphatase: 100 U/L (ref 38–126)
Anion gap: 8 (ref 5–15)
BUN: 6 mg/dL (ref 6–20)
CO2: 25 mmol/L (ref 22–32)
Calcium: 8.5 mg/dL — ABNORMAL LOW (ref 8.9–10.3)
Chloride: 104 mmol/L (ref 98–111)
Creatinine, Ser: 0.89 mg/dL (ref 0.61–1.24)
GFR calc Af Amer: >60 mL/min (ref >60)
GFR calc non Af Amer: >60 mL/min (ref >60)
Glucose, Bld: 134 mg/dL — ABNORMAL HIGH (ref 70–99)
Potassium: 3.3 mmol/L — ABNORMAL LOW (ref 3.5–5.1)
Sodium: 137 mmol/L (ref 135–145)
Total Bilirubin: 0.5 mg/dL (ref 0.3–1.2)
Total Protein: 6.4 g/dL — ABNORMAL LOW (ref 6.5–8.1)

## 2020-02-15 LAB — MAGNESIUM: Magnesium: 2 mg/dL (ref 1.7–2.4)

## 2020-02-15 MED ORDER — LABETALOL HCL 5 MG/ML IV SOLN
INTRAVENOUS | Status: AC
  Administered 2020-02-15: 20 mg via INTRAVENOUS
  Filled 2020-02-15: qty 4

## 2020-02-15 MED ORDER — POTASSIUM CHLORIDE CRYS ER 20 MEQ PO TBCR
20.0000 meq | EXTENDED_RELEASE_TABLET | ORAL | Status: AC
  Administered 2020-02-15 (×2): 20 meq via ORAL
  Filled 2020-02-15 (×2): qty 1

## 2020-02-15 MED ORDER — LABETALOL HCL 5 MG/ML IV SOLN
10.0000 mg | INTRAVENOUS | Status: DC | PRN
  Administered 2020-02-15 – 2020-02-16 (×9): 20 mg via INTRAVENOUS
  Filled 2020-02-15 (×9): qty 4

## 2020-02-15 MED ORDER — CARVEDILOL 25 MG PO TABS
25.0000 mg | ORAL_TABLET | Freq: Two times a day (BID) | ORAL | Status: DC
  Administered 2020-02-15 – 2020-02-17 (×5): 25 mg via ORAL
  Filled 2020-02-15 (×5): qty 1

## 2020-02-15 MED ORDER — SENNA 8.6 MG PO TABS
2.0000 | ORAL_TABLET | Freq: Every day | ORAL | Status: DC | PRN
  Administered 2020-02-15 – 2020-02-16 (×2): 17.2 mg via ORAL
  Filled 2020-02-15 (×2): qty 2

## 2020-02-15 NOTE — Progress Notes (Addendum)
  Progress Note    02/15/2020 10:58 AM  Subjective: Denies pain in BLE.  Has lower abdominal pain but this improved with flatus yesterday.   Vitals:   02/15/20 1030 02/15/20 1045  BP: 128/77 127/77  Pulse: 71 71  Resp: (!) 26 (!) 30  Temp:    SpO2: 98% 97%   Physical Exam: Lungs:  Non labored Extremities:  Palpable L DP pulse 2+; faintly palpable R ATA 1+ Abdomen:  Soft, tenderness to deep palpation of lower abdomen Neurologic: A&O  CBC    Component Value Date/Time   WBC 8.8 02/15/2020 0033   RBC 4.10 (L) 02/15/2020 0033   HGB 10.9 (L) 02/15/2020 0033   HCT 35.2 (L) 02/15/2020 0033   PLT 365 02/15/2020 0033   MCV 85.9 02/15/2020 0033   MCH 26.6 02/15/2020 0033   MCHC 31.0 02/15/2020 0033   RDW 15.7 (H) 02/15/2020 0033   LYMPHSABS 1.2 02/12/2020 0407   MONOABS 1.3 (H) 02/12/2020 0407   EOSABS 0.2 02/12/2020 0407   BASOSABS 0.1 02/12/2020 0407    BMET    Component Value Date/Time   NA 137 02/15/2020 0033   K 3.3 (L) 02/15/2020 0033   CL 104 02/15/2020 0033   CO2 25 02/15/2020 0033   GLUCOSE 134 (H) 02/15/2020 0033   BUN 6 02/15/2020 0033   CREATININE 0.89 02/15/2020 0033   CALCIUM 8.5 (L) 02/15/2020 0033   GFRNONAA >60 02/15/2020 0033   GFRAA >60 02/15/2020 0033    INR    Component Value Date/Time   INR 1.1 02/12/2020 1153     Intake/Output Summary (Last 24 hours) at 02/15/2020 1058 Last data filed at 02/15/2020 1000 Gross per 24 hour  Intake 3668.96 ml  Output 7250 ml  Net -3581.04 ml     Assessment/Plan:  44 y.o. male with type B aortic dissection   BLE well perfused with palpable L DP and faintly palpable R ATA No signs or symptoms of malperfusion Constipation: added senna HTN: continue weaning cardene per primary team; p.o. meds also titrated Repeat CTA c/a/p 1 month Home when HTN and pain controlled with p.o meds   Emilie Rutter, PA-C [facility] [phone] 02/15/2020 10:58 AM  I have examined the patient,  reviewed and agree with above.  Comfortable.  Denies abdominal pain.  Transitioning to oral meds.  Following with you.  No evidence of malperfusion  Gretta Began, MD 02/15/2020 2:43 PM

## 2020-02-15 NOTE — Progress Notes (Signed)
NAME:  Joseph Dyer, MRN:  [id number], DOB:  02-17-1976, LOS: 3 ADMISSION DATE:  02/12/2020, CONSULTATION DATE:  02/12/20 REFERRING MD: Dr Myrtis Ser , CHIEF COMPLAINT:  Abdominal pain  Brief History   36 black male with abdominal pain to osh, left ama and returned requiring transfer to ICU at [facility]  History of present illness   44 yo male with pmh of untreated htn, presented to osh with abdominal pain that was worsening. He states that it originally began Saturday evening after eating and was not relieved by any otc meds. Discomfort progressed and was worse when laying on his back however when laying on his abdomen his back hurt.   Pt denies any pmh stating he only took anti-htn meds when he was incarcerated but stopped them when he hleft. Denies any other complains  Past Medical History   Past Medical History:  Diagnosis Date  . Acid reflux    WATCHES DIET/  TAKES TUMS PRN  . Hypertension   . Nocturia   . Right hydrocele     Significant Hospital Events   Admitted to [facility] for AO dissection  Consults:  Vascular surgery 9/14  Procedures:  None  Significant Diagnostic Tests:  cta chest/abdomen/pelvis 9/14: 1. 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 2. There is retroperitoneal stranding about the aorta and iliacs which is presumably related to the dissection. The diffuseness of stranding suggests inflammation rather than leakage, but there is no aneurysm or clear aortitis. Recommend vascular surgery consultation and consideration of short term follow-up CT. 3. 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 4. Symmetric lumen enhancement in the arterial phase without evidence of visceral ischemia. 5. Premature atherosclerosis.   Micro Data:  8/30 sars2: POSITIVE  Antimicrobials:  none  Interim history/subjective:   Doing well this morning. Up in  chair.   Objective   Blood pressure (!) 150/93, pulse 85, temperature 98.1 F (36.7 C), temperature source Oral, resp. rate 17, height 6' (1.829 m), weight 88.1 kg, SpO2 100 %.        Intake/Output Summary (Last 24 hours) at 02/15/2020 1152 Last data filed at 02/15/2020 1100 Gross per 24 hour  Intake 3541.98 ml  Output 7000 ml  Net -3458.02 ml   Filed Weights   02/12/20 0814 02/13/20 0500 02/15/20 0500  Weight: 81.6 kg 86.6 kg 88.1 kg    Examination: General: adult male, resting in chair  HEENT: NCAT Lungs: CTAB  Cardiovascular: RRR, no mrg  Abdomen: soft  Extremities: no edema  Skin: no rash, tattoos  Neuro: following commands   GU: deferred  Resolved Hospital Problem list     Assessment & Plan:   Acute type B Ao dissection:  - BP control  - medical management  - off cardene infusion  - coreg added  - continue amlodipine, lisinopril   HTN:  - up titration of oral meds   Hypokalemia:  hypomag: - Mag >2 K >4   Normocytic anemia:  -observe   Needs PT/OT assessment  And outpatient PCP setup   Stable for transfer to cardiac tele.   Best practice:  Diet:reg diet Pain/Anxiety/Delirium protocol (if indicated): none  VAP protocol (if indicated): n/a DVT prophylaxis: scd GI prophylaxis: ppi Glucose control: monitoring Mobility: bedrest Code Status: FULL Family Communication: pt updated at length Disposition: ICU  Labs   CBC: Recent Labs  Lab 02/12/20 0407 02/13/20 0044 02/14/20 0239 02/15/20 6440  WBC 12.1* 11.4* 11.5* 8.8  NEUTROABS 9.3*  --   --   --   HGB 11.5* 12.1* 11.6* 10.9*  HCT 35.6* 38.4* 37.0* 35.2*  MCV 84.8 86.9 85.3 85.9  PLT 341 413* 390 365    Basic Metabolic Panel: Recent Labs  Lab 02/12/20 0407 02/12/20 1153 02/13/20 0044 02/14/20 0239 02/15/20 0033  NA 138 138 140 136 137  K 2.6* 3.2* 3.2* 3.3* 3.3*  CL 101 104 104 101 104  CO2 25 24 28 25 25   GLUCOSE 207* 90 121* 143* 134*  BUN 10 6 6 8 6   CREATININE 1.01  0.91 0.90 0.93 0.89  CALCIUM 8.5* 8.4* 8.7* 8.5* 8.5*  MG  --  2.1 2.1 1.9 2.0   GFR: Estimated Creatinine Clearance: 115 mL/min (by C-G formula based on SCr of 0.89 mg/dL). Recent Labs  Lab 02/12/20 0407 02/13/20 0044 02/14/20 0239 02/15/20 0033  WBC 12.1* 11.4* 11.5* 8.8  LATICACIDVEN 1.6 1.0  --   --     Liver Function Tests: Recent Labs  Lab 02/12/20 0407 02/13/20 0044 02/14/20 0239 02/15/20 0033  AST 29 40 49* 38  ALT 18 38 35 46*  ALKPHOS 49 77 86 100  BILITOT 1.0 0.8 0.8 0.5  PROT 6.8 6.9 6.3* 6.4*  ALBUMIN 3.4* 3.1* 2.8* 2.8*   Recent Labs  Lab 02/12/20 0407  LIPASE 23   No results for input(s): AMMONIA in the last 168 hours.  ABG No results found for: PHART, PCO2ART, PO2ART, HCO3, TCO2, ACIDBASEDEF, O2SAT   Coagulation Profile: Recent Labs  Lab 02/12/20 1153  INR 1.1    Cardiac Enzymes: No results for input(s): CKTOTAL, CKMB, CKMBINDEX, TROPONINI in the last 168 hours.  HbA1C: No results found for: HGBA1C  CBG: No results for input(s): GLUCAP in the last 168 hours.    02/14/20, DO [facility] 02/15/2020 11:52 AM

## 2020-02-15 NOTE — Plan of Care (Signed)

## 2020-02-15 NOTE — Progress Notes (Signed)
Notified Dr Tonia Brooms that pts blood pressure was in 160s systolic. No PRN orders for hypertension. MD ordered 10-20 mg labetalol q86min for SBP >140.

## 2020-02-15 NOTE — Progress Notes (Signed)
eLink called regarding patients continued low potassium level of 3.3. Patient to be given kdur, due to limited IV access and patients ability to take PO.

## 2020-02-15 NOTE — Progress Notes (Signed)
K 3.3 Electrolytes replaced per protocol 

## 2020-02-16 DIAGNOSIS — E876 Hypokalemia: Secondary | ICD-10-CM

## 2020-02-16 DIAGNOSIS — I10 Essential (primary) hypertension: Secondary | ICD-10-CM

## 2020-02-16 DIAGNOSIS — D649 Anemia, unspecified: Secondary | ICD-10-CM

## 2020-02-16 LAB — COMPREHENSIVE METABOLIC PANEL
ALT: 38 U/L (ref 0–44)
AST: 27 U/L (ref 15–41)
Albumin: 2.7 g/dL — ABNORMAL LOW (ref 3.5–5.0)
Alkaline Phosphatase: 78 U/L (ref 38–126)
Anion gap: 9 (ref 5–15)
BUN: 9 mg/dL (ref 6–20)
CO2: 24 mmol/L (ref 22–32)
Calcium: 8.8 mg/dL — ABNORMAL LOW (ref 8.9–10.3)
Chloride: 103 mmol/L (ref 98–111)
Creatinine, Ser: 1.11 mg/dL (ref 0.61–1.24)
GFR calc Af Amer: >60 mL/min (ref >60)
GFR calc non Af Amer: >60 mL/min (ref >60)
Glucose, Bld: 124 mg/dL — ABNORMAL HIGH (ref 70–99)
Potassium: 3.3 mmol/L — ABNORMAL LOW (ref 3.5–5.1)
Sodium: 136 mmol/L (ref 135–145)
Total Bilirubin: 0.2 mg/dL — ABNORMAL LOW (ref 0.3–1.2)
Total Protein: 6 g/dL — ABNORMAL LOW (ref 6.5–8.1)

## 2020-02-16 LAB — CBC
HCT: 33 % — ABNORMAL LOW (ref 39.0–52.0)
Hemoglobin: 10.3 g/dL — ABNORMAL LOW (ref 13.0–17.0)
MCH: 26.6 pg (ref 26.0–34.0)
MCHC: 31.2 g/dL (ref 30.0–36.0)
MCV: 85.3 fL (ref 80.0–100.0)
Platelets: 321 10*3/uL (ref 150–400)
RBC: 3.87 MIL/uL — ABNORMAL LOW (ref 4.22–5.81)
RDW: 15.6 % — ABNORMAL HIGH (ref 11.5–15.5)
WBC: 6.8 10*3/uL (ref 4.0–10.5)
nRBC: 0 % (ref 0.0–0.2)

## 2020-02-16 LAB — MAGNESIUM: Magnesium: 1.9 mg/dL (ref 1.7–2.4)

## 2020-02-16 MED ORDER — POLYETHYLENE GLYCOL 3350 17 G PO PACK
17.0000 g | PACK | Freq: Every day | ORAL | Status: DC | PRN
  Administered 2020-02-17: 17 g via ORAL
  Filled 2020-02-16: qty 1

## 2020-02-16 MED ORDER — POTASSIUM CHLORIDE CRYS ER 20 MEQ PO TBCR
20.0000 meq | EXTENDED_RELEASE_TABLET | ORAL | Status: AC
  Administered 2020-02-16 (×2): 20 meq via ORAL
  Filled 2020-02-16 (×2): qty 1

## 2020-02-16 MED ORDER — MAGNESIUM SULFATE 2 GM/50ML IV SOLN
2.0000 g | Freq: Once | INTRAVENOUS | Status: AC
  Administered 2020-02-16: 2 g via INTRAVENOUS
  Filled 2020-02-16: qty 50

## 2020-02-16 MED ORDER — POTASSIUM CHLORIDE CRYS ER 20 MEQ PO TBCR
40.0000 meq | EXTENDED_RELEASE_TABLET | Freq: Once | ORAL | Status: AC
  Administered 2020-02-16: 40 meq via ORAL
  Filled 2020-02-16: qty 2

## 2020-02-16 MED ORDER — POTASSIUM CHLORIDE 10 MEQ/100ML IV SOLN
10.0000 meq | INTRAVENOUS | Status: AC
  Administered 2020-02-16 (×2): 10 meq via INTRAVENOUS
  Filled 2020-02-16 (×4): qty 100

## 2020-02-16 NOTE — Progress Notes (Signed)
K= 3.3 and Magnesium 1.9. Electrolytes replaced per [facility] electrolyte protocol.

## 2020-02-16 NOTE — Plan of Care (Signed)
°  Problem: Education: Goal: Knowledge of General Education information will improve Description: Including pain rating scale, medication(s)/side effects and non-pharmacologic comfort measures Outcome: Progressing   Problem: Health Behavior/Discharge Planning: Goal: Ability to manage health-related needs will improve Outcome: Progressing   Problem: Clinical Measurements: Goal: Ability to maintain clinical measurements within normal limits will improve Outcome: Progressing Goal: Diagnostic test results will improve Outcome: Progressing Goal: Respiratory complications will improve Outcome: Progressing Goal: Cardiovascular complication will be avoided Outcome: Progressing   Problem: Clinical Measurements: Goal: Diagnostic test results will improve Outcome: Progressing   Problem: Clinical Measurements: Goal: Respiratory complications will improve Outcome: Progressing   Problem: Clinical Measurements: Goal: Cardiovascular complication will be avoided Outcome: Progressing   Problem: Activity: Goal: Risk for activity intolerance will decrease Outcome: Progressing

## 2020-02-16 NOTE — Progress Notes (Signed)
  Progress Note    02/16/2020 9:45 AM  Subjective:  No overnight issues, denies pain  Vitals:   02/16/20 0735 02/16/20 0800  BP:  (!) 142/89  Pulse:  60  Resp:  19  Temp: 98.6 F (37 C)   SpO2:  100%    Physical Exam: Awake alert oriented Nonlabored respirations Abdomen is soft Palpable radial and dorsalis pedis pulses bilaterally  CBC    Component Value Date/Time   WBC 6.8 02/16/2020 0142   RBC 3.87 (L) 02/16/2020 0142   HGB 10.3 (L) 02/16/2020 0142   HCT 33.0 (L) 02/16/2020 0142   PLT 321 02/16/2020 0142   MCV 85.3 02/16/2020 0142   MCH 26.6 02/16/2020 0142   MCHC 31.2 02/16/2020 0142   RDW 15.6 (H) 02/16/2020 0142   LYMPHSABS 1.2 02/12/2020 0407   MONOABS 1.3 (H) 02/12/2020 0407   EOSABS 0.2 02/12/2020 0407   BASOSABS 0.1 02/12/2020 0407    BMET    Component Value Date/Time   NA 136 02/16/2020 0142   K 3.3 (L) 02/16/2020 0142   CL 103 02/16/2020 0142   CO2 24 02/16/2020 0142   GLUCOSE 124 (H) 02/16/2020 0142   BUN 9 02/16/2020 0142   CREATININE 1.11 02/16/2020 0142   CALCIUM 8.8 (L) 02/16/2020 0142   GFRNONAA >60 02/16/2020 0142   GFRAA >60 02/16/2020 0142    INR    Component Value Date/Time   INR 1.1 02/12/2020 1153     Intake/Output Summary (Last 24 hours) at 02/16/2020 0945 Last data filed at 02/16/2020 0600 Gross per 24 hour  Intake 1737 ml  Output 3220 ml  Net -1483 ml     Assessment/plan:  44 y.o. male is here with type B aortic dissection.  Appreciate critical care control of blood pressure.  Does not have evidence of malperfusion.  Okay for transition out of ICU from vascular standpoint we will plan for repeat CT angio in 4 to 6 weeks in our office.   Luddie Boghosian C. Randie Heinz, MD [facility] [place] Office: [phone] Pager: [phone]  02/16/2020 9:45 AM

## 2020-02-16 NOTE — Progress Notes (Signed)
PROGRESS NOTE    Joseph Dyer  GUY:[id number] DOB: [birth year] DOA: 02/12/2020 PCP: Patient, No Pcp Per   Brief Narrative:  44 yo male with pmh of untreated htn, presented to [facility] with abdominal pain that was worsening. He states that it originally began Saturday evening after eating and was not relieved by any otc meds. Discomfort progressed and was worse when laying on his back however when laying on his abdomen his back hurt.   Pt denies any pmh stating he only took anti-htn meds when he was incarcerated but stopped them when he hleft. Denies any other complains  PT was diagnosed with Aortic disect type b and admitted to inpt   Assessment & Plan:   Active Problems:   Dissection of aorta (HCC)   Acute type B Ao dissection:  - BP control  - medical management  - remians off cardene infusion  - coreg added by ICU  - continue amlodipine, lisinopril  - VASC SURG RECS F/UP SCANS IN 4-6 WEEKS  HTN:  - up titration of oral meds as above  Hypokalemia:  hypomag: - keep Mag >2 K >4  - recheck labs in AM  Normocytic anemia:  -observe    Stable for transfer to cardiac tele.   DVT prophylaxis: SCD/Compression stockings  Code Status: FULL    Code Status Orders  (From admission, onward)         Start     Ordered   02/12/20 1123  Full code  Continuous        02/12/20 1124        Code Status History    This patient has a current code status but no historical code status.   Advance Care Planning Activity     Family Communication: NONE TODAY  Disposition Plan:   Pt will remian inpt for continued management of bp and pain Consults called: None Admission status: Inpatient   Consultants:   vasc surg, pccm  Procedures:  DG Chest 2 View  Result Date: 01/28/2020 CLINICAL DATA:  Cough for 3 days, shortness of breath EXAM: CHEST - 2 VIEW COMPARISON:  None. FINDINGS: The heart size and mediastinal contours are within normal limits. Patchy bilateral airspace  opacities most pronounced within the upper lobes. No pleural effusion. No pneumothorax. The visualized skeletal structures are unremarkable. IMPRESSION: Patchy bilateral airspace opacities most pronounced within the upper lobes, suspicious for multifocal pneumonia. Electronically Signed   By: Duanne Guess D.O.   On: 01/28/2020 11:12   DG Abd 1 View  Result Date: 02/12/2020 CLINICAL DATA:  Abdominal pain.  COVID pneumonia. EXAM: ABDOMEN - 1 VIEW COMPARISON:  None. FINDINGS: Normal bowel gas pattern. No excessive stool retention. There is a rounded calcified density over the right lower quadrant measuring 6 mm. Clear lung bases. L4-5 disc degeneration. IMPRESSION: 1. Normal bowel gas pattern. 2. Calcification over the right lower quadrant which could be appendicolith. Electronically Signed   By: Marnee Spring M.D.   On: 02/12/2020 04:30   CT ABDOMEN PELVIS W CONTRAST  Result Date: 02/12/2020 CLINICAL DATA:  Abdominal pain for 2 weeks.  COVID positive. EXAM: CT ABDOMEN AND PELVIS WITH CONTRAST TECHNIQUE: Multidetector CT imaging of the abdomen and pelvis was performed using the standard protocol following bolus administration of intravenous contrast. CONTRAST:  OMNIPAQUE IOHEXOL 300 MG/ML  SOLN COMPARISON:  None. FINDINGS: Lower chest: Minimal patchy density at the lung bases, there is subsequent chest CT and recent history of COVID-19. Hepatobiliary: No focal liver abnormality.No evidence  of biliary obstruction or stone. Pancreas: 2.6 cm cystic density between the stomach and pancreatic tail. No evidence of acute pancreatitis. No visible complexity to the cyst. Spleen: Unremarkable. Adrenals/Urinary Tract: Negative adrenals. No hydronephrosis or stone. Unremarkable bladder. Stomach/Bowel: No focal inflammation or appendicitis . No bowel obstruction Vascular/Lymphatic: Aortic iliac dissection and retroperitoneal stranding. Compromised celiac axis. No mass or adenopathy. Reproductive:No pathologic  findings. Other: No ascites or pneumoperitoneum. Musculoskeletal: L4-5 disc space collapse with endplate irregularity but degenerative and chronic appearing with vacuum phenomenon arguing against discitis. The retroperitoneal inflammatory changes also extend well beyond this disc space. Critical Value/emergent results were called by telephone at the time of interpretation on 02/12/2020 at 5:50 am to provider Dr Eudelia Bunch, who verbally acknowledged these results. IMPRESSION: 1. 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 CTA was immediately performed after this study, please reference that report. 2. 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 Please correlate for history of pancreatitis. After convalescence enhanced MRI is recommended if there is no known pancreatitis history. 3. Premature atherosclerosis. Electronically Signed   By: Marnee Spring M.D.   On: 02/12/2020 05:51   DG Chest Port 1 View  Result Date: 02/13/2020 CLINICAL DATA:  Shortness of breath. EXAM: PORTABLE CHEST 1 VIEW COMPARISON:  February 12, 2020. FINDINGS: The heart size and mediastinal contours are within normal limits. Both lungs are clear. No pneumothorax or pleural effusion is noted. The visualized skeletal structures are unremarkable. IMPRESSION: No active disease. Electronically Signed   By: Lupita Raider M.D.   On: 02/13/2020 08:14   DG Chest Portable 1 View  Result Date: 02/12/2020 CLINICAL DATA:  COVID short of breath EXAM: PORTABLE CHEST 1 VIEW COMPARISON:  01/28/2020 FINDINGS: Improved aeration bilaterally. No acute consolidation or effusion. Stable cardiomediastinal silhouette. No pneumothorax. IMPRESSION: Improved aeration bilaterally. No focal pulmonary infiltrate. Electronically Signed   By: Jasmine Pang M.D.   On: 02/12/2020 01:39   CT Angio Chest/Abd/Pel for Dissection W and/or Wo Contrast  Result Date: 02/12/2020 CLINICAL DATA:  Thoracic  aortic aneurysm suspected. EXAM: CT ANGIOGRAPHY CHEST, ABDOMEN AND PELVIS TECHNIQUE: Non-contrast CT of the chest was initially obtained. Multidetector CT imaging through the chest, abdomen and pelvis was performed using the standard protocol during bolus administration of intravenous contrast. Multiplanar reconstructed images and MIPs were obtained and reviewed to evaluate the vascular anatomy. CONTRAST:  OMNIPAQUE IOHEXOL 350 MG/ML SOLN COMPARISON:  None. FINDINGS: CTA CHEST FINDINGS Cardiovascular: A noncontrast phase shows no intramural hematoma in the aorta. Probable left ventricular hypertrophy. No pericardial effusion. No pulmonary artery filling defects. Aortic dissection with flap beginning at the isthmus. Relatively symmetric enhancement of the 2 lumens in the chest. No aortic aneurysm. Mediastinum/Nodes: Negative for hematoma or inflammatory change. Lungs/Pleura: Mild patchy reticular density correlating with recent COVID pneumonia. No honeycombing, effusion, or air leak. Musculoskeletal: Spondylosis. Review of the MIP images confirms the above findings. CTA ABDOMEN AND PELVIS FINDINGS VASCULAR Aorta: Aortic dissection with relatively symmetric enhancement of the lumens in the arterial phase, less so in the portal venous phase. There is atheromatous calcification and intimal thickening that is premature for age. No discrete enhancing thick wall based on the abdomen and pelvis CT, but there is generalized retroperitoneal stranding. Celiac: There is a separate origin of the left gastric which arises from the true lumen and is widely patent. The remaining celiac branches are truncated at the false lumen, but still enhance due to reconstitution. Phrenic branches arise from  the aorta and are patent. SMA: Arises from the true lumen. No downstream branch occlusion or stenosis is seen Renals: Single bilateral renal artery. Early branching on the left. No asymmetric enhancement or aneurysm seen IMA: Patent,  from the false lumen Inflow: Dissection continues into the bilateral external iliac arteries, more extensive on the right where there is slightly less intense luminal enhancement. Veins: There is stranding around the iliac veins and lower IVC without vein expansion and negative history for leg swelling. Review of the MIP images confirms the above findings. NON-VASCULAR Nonvascular findings are described on preceding parenchymal phase abdomen and pelvis CT. Critical Value/emergent results were called by telephone at the time of interpretation on 02/12/2020 at 6:01 am to provider Dr Eudelia Bunch, who verbally acknowledged these results. Review of the MIP images confirms the above findings. IMPRESSION: 1. 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 2. There is retroperitoneal stranding about the aorta and iliacs which is presumably related to the dissection. The diffuseness of stranding suggests inflammation rather than leakage, but there is no aneurysm or clear aortitis. Recommend vascular surgery consultation and consideration of short term follow-up CT. 3. 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 4. Symmetric lumen enhancement in the arterial phase without evidence of visceral ischemia. 5. Premature atherosclerosis. Electronically Signed   By: Marnee Spring M.D.   On: 02/12/2020 06:04      Subjective: Reports pain, although resting comfortably in bed  Objective: Vitals:   02/16/20 0700 02/16/20 0735 02/16/20 0800 02/16/20 1130  BP: (!) 155/101  (!) 142/89   Pulse: 64  60   Resp: 14  19   Temp:  98.6 F (37 C)  (!) 97.5 F (36.4 C)  TempSrc:      SpO2: 100%  100%   Weight:      Height:        Intake/Output Summary (Last 24 hours) at 02/16/2020 1304 Last data filed at 02/16/2020 1100 Gross per 24 hour  Intake 760.63 ml  Output 3445 ml  Net -2684.37 ml   Filed  Weights   02/13/20 0500 02/15/20 0500 02/16/20 0600  Weight: 86.6 kg 88.1 kg 88 kg    Examination:  General exam: Appears calm and comfortable in bedside chair Respiratory system: Clear to auscultation. Respiratory effort normal. Cardiovascular system: S1 & S2 heard, RRR. No JVD, murmurs, rubs, gallops or clicks. No pedal edema. Gastrointestinal system: Abdomen is nondistended, soft and nontender. No organomegaly or masses felt. Normal bowel sounds heard. Central nervous system: Alert and oriented. No focal neurological deficits. Extremities: wwp, no edema Skin: No rashes, lesions or ulcers, extensive tatoos Psychiatry: Judgement and insight appear normal. Mood & affect appropriate.     Data Reviewed: I have personally reviewed following labs and imaging studies  CBC: Recent Labs  Lab 02/12/20 0407 02/13/20 0044 02/14/20 0239 02/15/20 0033 02/16/20 0142  WBC 12.1* 11.4* 11.5* 8.8 6.8  NEUTROABS 9.3*  --   --   --   --   HGB 11.5* 12.1* 11.6* 10.9* 10.3*  HCT 35.6* 38.4* 37.0* 35.2* 33.0*  MCV 84.8 86.9 85.3 85.9 85.3  PLT 341 413* 390 365 321   Basic Metabolic Panel: Recent Labs  Lab 02/12/20 1153 02/13/20 0044 02/14/20 0239 02/15/20 0033 02/16/20 0142  NA 138 140 136 137 136  K 3.2* 3.2* 3.3* 3.3* 3.3*  CL 104 104 101 104 103  CO2 24 28 25  25 24  GLUCOSE 90 121* 143* 134* 124*  BUN 6 6 8 6 9   CREATININE 0.91 0.90 0.93 0.89 1.11  CALCIUM 8.4* 8.7* 8.5* 8.5* 8.8*  MG 2.1 2.1 1.9 2.0 1.9   GFR: Estimated Creatinine Clearance: 92.2 mL/min (by C-G formula based on SCr of 1.11 mg/dL). Liver Function Tests: Recent Labs  Lab 02/12/20 0407 02/13/20 0044 02/14/20 0239 02/15/20 0033 02/16/20 0142  AST 29 40 49* 38 27  ALT 18 38 35 46* 38  ALKPHOS 49 77 86 100 78  BILITOT 1.0 0.8 0.8 0.5 0.2*  PROT 6.8 6.9 6.3* 6.4* 6.0*  ALBUMIN 3.4* 3.1* 2.8* 2.8* 2.7*   Recent Labs  Lab 02/12/20 0407  LIPASE 23   No results for input(s): AMMONIA in the last 168  hours. Coagulation Profile: Recent Labs  Lab 02/12/20 1153  INR 1.1   Cardiac Enzymes: No results for input(s): CKTOTAL, CKMB, CKMBINDEX, TROPONINI in the last 168 hours. BNP (last 3 results) No results for input(s): PROBNP in the last 8760 hours. HbA1C: No results for input(s): HGBA1C in the last 72 hours. CBG: No results for input(s): GLUCAP in the last 168 hours. Lipid Profile: No results for input(s): CHOL, HDL, LDLCALC, TRIG, CHOLHDL, LDLDIRECT in the last 72 hours. Thyroid Function Tests: No results for input(s): TSH, T4TOTAL, FREET4, T3FREE, THYROIDAB in the last 72 hours. Anemia Panel: No results for input(s): VITAMINB12, FOLATE, FERRITIN, TIBC, IRON, RETICCTPCT in the last 72 hours. Sepsis Labs: Recent Labs  Lab 02/12/20 0407 02/13/20 0044  LATICACIDVEN 1.6 1.0    Recent Results (from the past 240 hour(s))  MRSA PCR Screening     Status: None   Collection Time: 02/12/20  6:27 PM   Specimen: Nasopharyngeal  Result Value Ref Range Status   MRSA by PCR NEGATIVE NEGATIVE Final    Comment:        The GeneXpert MRSA Assay (FDA approved for NASAL specimens only), is one component of a comprehensive MRSA colonization surveillance program. It is not intended to diagnose MRSA infection nor to guide or monitor treatment for MRSA infections. Performed at [facility], [address] [place], [place] [address]          Radiology Studies: No results found.      Scheduled Meds: . amLODipine  10 mg Oral Daily  . carvedilol  25 mg Oral BID WC  . Chlorhexidine Gluconate Cloth  6 each Topical Daily  . influenza vac split quadrivalent PF  0.5 mL Intramuscular Tomorrow-1000  . lisinopril  20 mg Oral Daily  . pneumococcal 23 valent vaccine  0.5 mL Intramuscular Tomorrow-1000   Continuous Infusions:   LOS: 4 days    Time spent: 35 min    Burke Keelshristopher Deloris Moger, MD [facility]  If 7PM-7AM, please contact night-coverage  02/16/2020, 1:04  PM

## 2020-02-17 DIAGNOSIS — I10 Essential (primary) hypertension: Secondary | ICD-10-CM

## 2020-02-17 LAB — CBC
HCT: 35.6 % — ABNORMAL LOW (ref 39.0–52.0)
Hemoglobin: 11 g/dL — ABNORMAL LOW (ref 13.0–17.0)
MCH: 26.5 pg (ref 26.0–34.0)
MCHC: 30.9 g/dL (ref 30.0–36.0)
MCV: 85.8 fL (ref 80.0–100.0)
Platelets: 390 10*3/uL (ref 150–400)
RBC: 4.15 MIL/uL — ABNORMAL LOW (ref 4.22–5.81)
RDW: 15.1 % (ref 11.5–15.5)
WBC: 7.5 10*3/uL (ref 4.0–10.5)
nRBC: 0 % (ref 0.0–0.2)

## 2020-02-17 LAB — COMPREHENSIVE METABOLIC PANEL
ALT: 40 U/L (ref 0–44)
AST: 32 U/L (ref 15–41)
Albumin: 3 g/dL — ABNORMAL LOW (ref 3.5–5.0)
Alkaline Phosphatase: 71 U/L (ref 38–126)
Anion gap: 12 (ref 5–15)
BUN: 8 mg/dL (ref 6–20)
CO2: 23 mmol/L (ref 22–32)
Calcium: 9 mg/dL (ref 8.9–10.3)
Chloride: 102 mmol/L (ref 98–111)
Creatinine, Ser: 1.14 mg/dL (ref 0.61–1.24)
GFR calc Af Amer: >60 mL/min (ref >60)
GFR calc non Af Amer: >60 mL/min (ref >60)
Glucose, Bld: 133 mg/dL — ABNORMAL HIGH (ref 70–99)
Potassium: 3.6 mmol/L (ref 3.5–5.1)
Sodium: 137 mmol/L (ref 135–145)
Total Bilirubin: 0.6 mg/dL (ref 0.3–1.2)
Total Protein: 6.9 g/dL (ref 6.5–8.1)

## 2020-02-17 LAB — MAGNESIUM: Magnesium: 2 mg/dL (ref 1.7–2.4)

## 2020-02-17 MED ORDER — LISINOPRIL 20 MG PO TABS: 20.0000 mg | ORAL_TABLET | Freq: Every day | ORAL | 0 refills | Status: AC

## 2020-02-17 MED ORDER — CARVEDILOL 25 MG PO TABS: 25.0000 mg | ORAL_TABLET | Freq: Two times a day (BID) | ORAL | 0 refills | Status: AC

## 2020-02-17 MED ORDER — AMLODIPINE BESYLATE 10 MG PO TABS: 10.0000 mg | ORAL_TABLET | Freq: Every day | ORAL | 0 refills | Status: AC

## 2020-02-17 MED ORDER — LIDOCAINE VISCOUS HCL 2 % MT SOLN
15.0000 mL | Freq: Once | OROMUCOSAL | Status: AC
  Administered 2020-02-17: 15 mL via ORAL
  Filled 2020-02-17: qty 15

## 2020-02-17 MED ORDER — ALUM & MAG HYDROXIDE-SIMETH 200-200-20 MG/5ML PO SUSP
30.0000 mL | Freq: Once | ORAL | Status: AC
  Administered 2020-02-17: 30 mL via ORAL
  Filled 2020-02-17: qty 30

## 2020-02-17 MED ORDER — PANTOPRAZOLE SODIUM 40 MG PO TBEC: 40.0000 mg | DELAYED_RELEASE_TABLET | Freq: Every day | ORAL | 1 refills | Status: AC

## 2020-02-17 MED ORDER — SODIUM CHLORIDE 0.9 % IV SOLN
10.0000 mg | Freq: Once | INTRAVENOUS | Status: AC | PRN
  Administered 2020-02-17: 10 mg via INTRAVENOUS
  Filled 2020-02-17 (×4): qty 1

## 2020-02-17 NOTE — Progress Notes (Signed)
Pt called RN to room stating "I need my nurse to suction my trach" , upon suctioning RN met resistance , RT notified and suspected obstruction  ENT paged and with PT  Will continue to monitor

## 2020-02-17 NOTE — Progress Notes (Signed)
On getting report this am pt had just left shower when RN entered room , pt does not have a shower order.  PT educated  Will continue to monitor

## 2020-02-17 NOTE — Progress Notes (Signed)
  Progress Note    02/17/2020 10:49 AM   Subjective: No overnight issues, was having some abdominal pain relieved with MiraLAX  Vitals:   02/17/20 0614 02/17/20 0735  BP: (!) 165/102 (!) 147/80  Pulse: 67 (!) 57  Resp: 17 20  Temp: 97.8 F (36.6 C) 98.6 F (37 C)  SpO2: 100% 100%    Physical Exam: Awake alert oriented Bilateral radial artery pulses are palpable Abdomen is soft nontender Femoral pulses are palpable 1+ dorsalis pedis on the right and 2+ on the left  CBC    Component Value Date/Time   WBC 6.8 02/16/2020 0142   RBC 3.87 (L) 02/16/2020 0142   HGB 10.3 (L) 02/16/2020 0142   HCT 33.0 (L) 02/16/2020 0142   PLT 321 02/16/2020 0142   MCV 85.3 02/16/2020 0142   MCH 26.6 02/16/2020 0142   MCHC 31.2 02/16/2020 0142   RDW 15.6 (H) 02/16/2020 0142   LYMPHSABS 1.2 02/12/2020 0407   MONOABS 1.3 (H) 02/12/2020 0407   EOSABS 0.2 02/12/2020 0407   BASOSABS 0.1 02/12/2020 0407    BMET    Component Value Date/Time   NA 136 02/16/2020 0142   K 3.3 (L) 02/16/2020 0142   CL 103 02/16/2020 0142   CO2 24 02/16/2020 0142   GLUCOSE 124 (H) 02/16/2020 0142   BUN 9 02/16/2020 0142   CREATININE 1.11 02/16/2020 0142   CALCIUM 8.8 (L) 02/16/2020 0142   GFRNONAA >60 02/16/2020 0142   GFRAA >60 02/16/2020 0142    INR    Component Value Date/Time   INR 1.1 02/12/2020 1153     Intake/Output Summary (Last 24 hours) at 02/17/2020 1049 Last data filed at 02/17/2020 0858 Gross per 24 hour  Intake 720 ml  Output 1000 ml  Net -280 ml     Assessment/plan:  45 y.o. 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  Continue blood pressure and heart rate control per primary we will plan to rescan him as an outpatient in approximately 4 weeks.    Joseph Dyer C. Randie Heinz, MD [facility] Office: [phone] Pager: [phone]  02/17/2020 10:49 AM

## 2020-02-17 NOTE — Discharge Summary (Signed)
Physician Discharge Summary  Jim Likeric Blancett ZOX:[id number] DOB: [birth year] DOA: 02/12/2020  PCP: Patient, No Pcp Per  Admit date: 02/12/2020 Discharge date: 02/17/2020  Admitted From: Inpatient Disposition: home  Recommendations for Outpatient Follow-up:  1. Follow up with PCP in 1-2 weeks 2. F/yup vasc surg in 4 weeks for repeat cta:  Home Health:No Equipment/Devices:none  Discharge Condition:Stable CODE STATUS:Full code Diet recommendation: Cardiac diet  Brief/Interim Summary: 44 yo male with pmh of untreated htn, presented to osh with abdominal pain that was worsening. He states that it originally began Saturday evening after eating and was not relieved by any otc meds. Discomfort progressed and was worse when laying on his back however when laying on his abdomen his back hurt.   Pt denies any pmh stating he only took anti-htn meds when he was incarcerated but stopped them when he hleft. Denies any other complains  PT was diagnosed with Aortic disect type b and [facility]  Hosp course: Acute type B Ao dissection:  -BP control achieved as below - medical management while in the hospital - was placed on and then weaned off cardene infusion  - coreg added by ICU which he toelrated - continue amlodipine, lisinopril - VASC SURG RECS F/UP SCANS IN 4-6 WEEKS  HTN: - up titration of oral medsas above while in the hospital - will d/c on antiHTN as above, rx transmitted to [facility] - consult placed to [facility] for assistance with new PCP    Hypokalemia:  hypomag: -was repleted while in hospital  Normocytic anemia:  -observe   Discharge Diagnoses:  Active Problems:   Dissection of aorta [facility](HCC)    Discharge Instructions  Discharge Instructions    Call MD for:  difficulty breathing, headache or visual disturbances   Complete by: As directed    Call MD for:  extreme fatigue   Complete by: As directed    Call MD for:  persistant dizziness or light-headedness    Complete by: As directed    Call MD for:  persistant nausea and vomiting   Complete by: As directed    Call MD for:  temperature >100.4   Complete by: As directed    Diet - low sodium heart healthy   Complete by: As directed    Increase activity slowly   Complete by: As directed      Allergies as of 02/17/2020   No Known Allergies     Medication List    STOP taking these medications   acetaminophen 500 MG tablet Commonly known as: TYLENOL   cephALEXin 250 MG capsule Commonly known as: Keflex   HYDROcodone-acetaminophen 5-325 MG tablet Commonly known as: Norco     TAKE these medications   amLODipine 10 MG tablet Commonly known as: NORVASC Take 1 tablet (10 mg total) by mouth daily. Start taking on: February 18, 2020   calcium carbonate 500 MG chewable tablet Commonly known as: TUMS - dosed in mg elemental calcium Chew 1 tablet by mouth as needed for heartburn.   carvedilol 25 MG tablet Commonly known as: COREG Take 1 tablet (25 mg total) by mouth 2 (two) times daily with a meal.   ibuprofen 200 MG tablet Commonly known as: ADVIL Take 200 mg by mouth every 6 (six) hours as needed for pain.   lisinopril 20 MG tablet Commonly known as: ZESTRIL Take 1 tablet (20 mg total) by mouth daily. Start taking on: February 18, 2020   pantoprazole 40 MG tablet Commonly known as: Protonix Take 1  tablet (40 mg total) by mouth daily.       No Known Allergies  Consultations:  vasc surg   Procedures/Studies: DG Chest 2 View  Result Date: 01/28/2020 CLINICAL DATA:  Cough for 3 days, shortness of breath EXAM: CHEST - 2 VIEW COMPARISON:  None. FINDINGS: The heart size and mediastinal contours are within normal limits. Patchy bilateral airspace opacities most pronounced within the upper lobes. No pleural effusion. No pneumothorax. The visualized skeletal structures are unremarkable. IMPRESSION: Patchy bilateral airspace opacities most pronounced within the upper lobes,  suspicious for multifocal pneumonia. Electronically Signed   By: Duanne Guess D.O.   On: 01/28/2020 11:12   DG Abd 1 View  Result Date: 02/12/2020 CLINICAL DATA:  Abdominal pain.  COVID pneumonia. EXAM: ABDOMEN - 1 VIEW COMPARISON:  None. FINDINGS: Normal bowel gas pattern. No excessive stool retention. There is a rounded calcified density over the right lower quadrant measuring 6 mm. Clear lung bases. L4-5 disc degeneration. IMPRESSION: 1. Normal bowel gas pattern. 2. Calcification over the right lower quadrant which could be appendicolith. Electronically Signed   By: Marnee Spring M.D.   On: 02/12/2020 04:30   CT ABDOMEN PELVIS W CONTRAST  Result Date: 02/12/2020 CLINICAL DATA:  Abdominal pain for 2 weeks.  COVID positive. EXAM: CT ABDOMEN AND PELVIS WITH CONTRAST TECHNIQUE: Multidetector CT imaging of the abdomen and pelvis was performed using the standard protocol following bolus administration of intravenous contrast. CONTRAST:  OMNIPAQUE IOHEXOL 300 MG/ML  SOLN COMPARISON:  None. FINDINGS: Lower chest: Minimal patchy density at the lung bases, there is subsequent chest CT and recent history of COVID-19. Hepatobiliary: No focal liver abnormality.No evidence of biliary obstruction or stone. Pancreas: 2.6 cm cystic density between the stomach and pancreatic tail. No evidence of acute pancreatitis. No visible complexity to the cyst. Spleen: Unremarkable. Adrenals/Urinary Tract: Negative adrenals. No hydronephrosis or stone. Unremarkable bladder. Stomach/Bowel: No focal inflammation or appendicitis . No bowel obstruction Vascular/Lymphatic: Aortic iliac dissection and retroperitoneal stranding. Compromised celiac axis. No mass or adenopathy. Reproductive:No pathologic findings. Other: No ascites or pneumoperitoneum. Musculoskeletal: L4-5 disc space collapse with endplate irregularity but degenerative and chronic appearing with vacuum phenomenon arguing against discitis. The retroperitoneal  inflammatory changes also extend well beyond this disc space. Critical Value/emergent results were called by telephone at the time of interpretation on 02/12/2020 at 5:50 am to provider Dr Eudelia Bunch, who verbally acknowledged these results. IMPRESSION: 1. 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 CTA was immediately performed after this study, please reference that report. 2. 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 Please correlate for history of pancreatitis. After convalescence enhanced MRI is recommended if there is no known pancreatitis history. 3. Premature atherosclerosis. Electronically Signed   By: Marnee Spring M.D.   On: 02/12/2020 05:51   DG Chest Port 1 View  Result Date: 02/13/2020 CLINICAL DATA:  Shortness of breath. EXAM: PORTABLE CHEST 1 VIEW COMPARISON:  February 12, 2020. FINDINGS: The heart size and mediastinal contours are within normal limits. Both lungs are clear. No pneumothorax or pleural effusion is noted. The visualized skeletal structures are unremarkable. IMPRESSION: No active disease. Electronically Signed   By: Lupita Raider M.D.   On: 02/13/2020 08:14   DG Chest Portable 1 View  Result Date: 02/12/2020 CLINICAL DATA:  COVID short of breath EXAM: PORTABLE CHEST 1 VIEW COMPARISON:  01/28/2020 FINDINGS: Improved aeration bilaterally. No acute consolidation or effusion. Stable cardiomediastinal silhouette. No pneumothorax.  IMPRESSION: Improved aeration bilaterally. No focal pulmonary infiltrate. Electronically Signed   By: Jasmine Pang M.D.   On: 02/12/2020 01:39   CT Angio Chest/Abd/Pel for Dissection W and/or Wo Contrast  Result Date: 02/12/2020 CLINICAL DATA:  Thoracic aortic aneurysm suspected. EXAM: CT ANGIOGRAPHY CHEST, ABDOMEN AND PELVIS TECHNIQUE: Non-contrast CT of the chest was initially obtained. Multidetector CT imaging through the chest, abdomen and pelvis was performed using the  standard protocol during bolus administration of intravenous contrast. Multiplanar reconstructed images and MIPs were obtained and reviewed to evaluate the vascular anatomy. CONTRAST:  OMNIPAQUE IOHEXOL 350 MG/ML SOLN COMPARISON:  None. FINDINGS: CTA CHEST FINDINGS Cardiovascular: A noncontrast phase shows no intramural hematoma in the aorta. Probable left ventricular hypertrophy. No pericardial effusion. No pulmonary artery filling defects. Aortic dissection with flap beginning at the isthmus. Relatively symmetric enhancement of the 2 lumens in the chest. No aortic aneurysm. Mediastinum/Nodes: Negative for hematoma or inflammatory change. Lungs/Pleura: Mild patchy reticular density correlating with recent COVID pneumonia. No honeycombing, effusion, or air leak. Musculoskeletal: Spondylosis. Review of the MIP images confirms the above findings. CTA ABDOMEN AND PELVIS FINDINGS VASCULAR Aorta: Aortic dissection with relatively symmetric enhancement of the lumens in the arterial phase, less so in the portal venous phase. There is atheromatous calcification and intimal thickening that is premature for age. No discrete enhancing thick wall based on the abdomen and pelvis CT, but there is generalized retroperitoneal stranding. Celiac: There is a separate origin of the left gastric which arises from the true lumen and is widely patent. The remaining celiac branches are truncated at the false lumen, but still enhance due to reconstitution. Phrenic branches arise from the aorta and are patent. SMA: Arises from the true lumen. No downstream branch occlusion or stenosis is seen Renals: Single bilateral renal artery. Early branching on the left. No asymmetric enhancement or aneurysm seen IMA: Patent, from the false lumen Inflow: Dissection continues into the bilateral external iliac arteries, more extensive on the right where there is slightly less intense luminal enhancement. Veins: There is stranding around the iliac  veins and lower IVC without vein expansion and negative history for leg swelling. Review of the MIP images confirms the above findings. NON-VASCULAR Nonvascular findings are described on preceding parenchymal phase abdomen and pelvis CT. Critical Value/emergent results were called by telephone at the time of interpretation on 02/12/2020 at 6:01 am to provider Dr Eudelia Bunch, who verbally acknowledged these results. Review of the MIP images confirms the above findings. IMPRESSION: 1. 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 2. There is retroperitoneal stranding about the aorta and iliacs which is presumably related to the dissection. The diffuseness of stranding suggests inflammation rather than leakage, but there is no aneurysm or clear aortitis. Recommend vascular surgery consultation and consideration of short term follow-up CT. 3. 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 4. Symmetric lumen enhancement in the arterial phase without evidence of visceral ischemia. 5. Premature atherosclerosis. Electronically Signed   By: Marnee Spring M.D.   On: 02/12/2020 06:04       Subjective:   Discharge Exam: Vitals:   02/17/20 0614 02/17/20 0735  BP: (!) 165/102 (!) 147/80  Pulse: 67 (!) 57  Resp: 17 20  Temp: 97.8 F (36.6 C) 98.6 F (37 C)  SpO2: 100% 100%   Vitals:   02/16/20 1950 02/16/20 1959 02/17/20 0614 02/17/20 0735  BP: (!) 158/88 Marland Kitchen)  123/54 (!) 165/102 (!) 147/80  Pulse: 60 78 67 (!) 57  Resp:  17 17 20   Temp:  98.2 F (36.8 C) 97.8 F (36.6 C) 98.6 F (37 C)  TempSrc:  Oral Oral Oral  SpO2: 100% 94% 100% 100%  Weight:   86.9 kg   Height:        General: Pt is alert, awake, not in acute distress Cardiovascular: RRR, S1/S2 +, no rubs, no gallops Respiratory: CTA bilaterally, no wheezing, no rhonchi Abdominal: Soft, NT, ND, bowel sounds + Extremities:  no edema, no cyanosis    The results of significant diagnostics from this hospitalization (including imaging, microbiology, ancillary and laboratory) are listed below for reference.     Microbiology: Recent Results (from the past 240 hour(s))  MRSA PCR Screening     Status: None   Collection Time: 02/12/20  6:27 PM   Specimen: Nasopharyngeal  Result Value Ref Range Status   MRSA by PCR NEGATIVE NEGATIVE Final    Comment:        The GeneXpert MRSA Assay (FDA approved for NASAL specimens only), is one component of a comprehensive MRSA colonization surveillance program. It is not intended to diagnose MRSA infection nor to guide or monitor treatment for MRSA infections. Performed at [facility], [address], [place], [place]      Labs: BNP (last 3 results) No results for input(s): BNP in the last 8760 hours. Basic Metabolic Panel: Recent Labs  Lab 02/12/20 1153 02/13/20 0044 02/14/20 0239 02/15/20 0033 02/16/20 0142  NA 138 140 136 137 136  K 3.2* 3.2* 3.3* 3.3* 3.3*  CL 104 104 101 104 103  CO2 24 28 25 25 24   GLUCOSE 90 121* 143* 134* 124*  BUN 6 6 8 6 9   CREATININE 0.91 0.90 0.93 0.89 1.11  CALCIUM 8.4* 8.7* 8.5* 8.5* 8.8*  MG 2.1 2.1 1.9 2.0 1.9   Liver Function Tests: Recent Labs  Lab 02/12/20 0407 02/13/20 0044 02/14/20 0239 02/15/20 0033 02/16/20 0142  AST 29 40 49* 38 27  ALT 18 38 35 46* 38  ALKPHOS 49 77 86 100 78  BILITOT 1.0 0.8 0.8 0.5 0.2*  PROT 6.8 6.9 6.3* 6.4* 6.0*  ALBUMIN 3.4* 3.1* 2.8* 2.8* 2.7*   Recent Labs  Lab 02/12/20 0407  LIPASE 23   No results for input(s): AMMONIA in the last 168 hours. CBC: Recent Labs  Lab 02/12/20 0407 02/13/20 0044 02/14/20 0239 02/15/20 0033 02/16/20 0142  WBC 12.1* 11.4* 11.5* 8.8 6.8  NEUTROABS 9.3*  --   --   --   --   HGB 11.5* 12.1* 11.6* 10.9* 10.3*  HCT 35.6* 38.4* 37.0* 35.2* 33.0*  MCV 84.8 86.9 85.3 85.9 85.3  PLT 341 413* 390 365 321   Cardiac Enzymes: No  results for input(s): CKTOTAL, CKMB, CKMBINDEX, TROPONINI in the last 168 hours. BNP: Invalid input(s): POCBNP CBG: No results for input(s): GLUCAP in the last 168 hours. D-Dimer No results for input(s): DDIMER in the last 72 hours. Hgb A1c No results for input(s): HGBA1C in the last 72 hours. Lipid Profile No results for input(s): CHOL, HDL, LDLCALC, TRIG, CHOLHDL, LDLDIRECT in the last 72 hours. Thyroid function studies No results for input(s): TSH, T4TOTAL, T3FREE, THYROIDAB in the last 72 hours.  Invalid input(s): FREET3 Anemia work up No results for input(s): VITAMINB12, FOLATE, FERRITIN, TIBC, IRON, RETICCTPCT in the last 72 hours. Urinalysis No results found for: COLORURINE, APPEARANCEUR, LABSPEC, PHURINE, GLUCOSEU, HGBUR,  BILIRUBINUR, KETONESUR, PROTEINUR, UROBILINOGEN, NITRITE, LEUKOCYTESUR Sepsis Labs Invalid input(s): PROCALCITONIN,  WBC,  LACTICIDVEN Microbiology Recent Results (from the past 240 hour(s))  MRSA PCR Screening     Status: None   Collection Time: 02/12/20  6:27 PM   Specimen: Nasopharyngeal  Result Value Ref Range Status   MRSA by PCR NEGATIVE NEGATIVE Final    Comment:        The GeneXpert MRSA Assay (FDA approved for NASAL specimens only), is one component of a comprehensive MRSA colonization surveillance program. It is not intended to diagnose MRSA infection nor to guide or monitor treatment for MRSA infections. Performed at [facility], [address] [place], [place] [address]      Time coordinating discharge: Over 30 minutes  SIGNED:   Burke Keels, MD  [facility] 02/17/2020, 10:58 AM Pager   If 7PM-7AM, please contact night-coverage [email or web address] Password [email or web address]

## 2020-02-17 NOTE — Care Management (Signed)
Instructed patient on how to obtain PCP in network. He understands how to call and schedule appointment.  Reviewed medications, no anticipated problems with coverage.

## 2020-02-20 ENCOUNTER — Other Ambulatory Visit: Payer: Self-pay

## 2020-02-20 DIAGNOSIS — I71019 Dissection of thoracic aorta, unspecified: Secondary | ICD-10-CM

## 2020-03-14 ENCOUNTER — Other Ambulatory Visit: Payer: Self-pay

## 2020-03-14 ENCOUNTER — Other Ambulatory Visit: Payer: BC Managed Care – PPO

## 2020-03-14 ENCOUNTER — Ambulatory Visit
Admission: RE | Admit: 2020-03-14 | Discharge: 2020-03-14 | Disposition: A | Discharge status: Home / Self Care | Payer: BC Managed Care – PPO | Source: Ambulatory Visit | Attending: Vascular Surgery | Admitting: Vascular Surgery

## 2020-03-14 DIAGNOSIS — I7101 Dissection of thoracic aorta: Secondary | ICD-10-CM

## 2020-03-14 DIAGNOSIS — I71019 Dissection of thoracic aorta, unspecified: Secondary | ICD-10-CM

## 2020-03-14 MED ORDER — IOPAMIDOL (ISOVUE-370) INJECTION 76%
75.0000 mL | Freq: Once | INTRAVENOUS | Status: AC | PRN
  Administered 2020-03-14: 75 mL via INTRAVENOUS

## 2020-03-21 ENCOUNTER — Encounter: Payer: Self-pay | Admitting: Vascular Surgery

## 2020-03-21 ENCOUNTER — Other Ambulatory Visit: Payer: Self-pay

## 2020-03-21 ENCOUNTER — Other Ambulatory Visit: Payer: BC Managed Care – PPO

## 2020-03-21 ENCOUNTER — Ambulatory Visit (INDEPENDENT_AMBULATORY_CARE_PROVIDER_SITE_OTHER): Payer: BC Managed Care – PPO | Admitting: Vascular Surgery

## 2020-03-21 VITALS — BP 142/91 | HR 65 | Temp 98.4°F | Resp 20 | Ht 72.0 in | Wt 205.0 lb

## 2020-03-21 DIAGNOSIS — I7101 Dissection of thoracic aorta: Secondary | ICD-10-CM | POA: Diagnosis not present

## 2020-03-21 DIAGNOSIS — I71019 Dissection of thoracic aorta, unspecified: Secondary | ICD-10-CM

## 2020-03-21 NOTE — Progress Notes (Signed)
Patient ID: Joseph Dyer, male   DOB: 1976-02-18, 44 y.o.   MRN: [id number]  Reason for Consult: Follow-up   Referred by [place], [place], NP  Subjective:     HPI:  Joseph Dyer is a 44 y.o. male recently admitted with chest pain found to have type B aortic dissection. At the time he was hypertensive. States that he recently did run out of his antihypertensive medications. He has returned to work on [employer] duty. CT scan performed prior to today's visit. Does not have any new back or abdominal pain. States that he does have some pain in his thighs while working but overall he has recovered well.  Past Medical History:  Diagnosis Date  . Acid reflux    WATCHES DIET/  TAKES TUMS PRN  . Hypertension   . Nocturia   . Right hydrocele    No family history on file. Past Surgical History:  Procedure Laterality Date  . HYDROCELE EXCISION Right 01/02/2013   Procedure: HYDROCELECTOMY ADULT;  Surgeon: Martina Sinner, MD;  Location: [facility];  Service: Urology;  Laterality: Right;    Short Social History:  Social History   Tobacco Use  . Smoking status: Current Every Day Smoker    Packs/day: 0.25    Years: 15.00    Pack years: 3.75    Types: Cigarettes  . Smokeless tobacco: Never Used  Substance Use Topics  . Alcohol use: Yes    Alcohol/week: 2.0 standard drinks    Types: 2 Standard drinks or equivalent per week    No Known Allergies  Current Outpatient Medications  Medication Sig Dispense Refill  . calcium carbonate (TUMS - DOSED IN MG ELEMENTAL CALCIUM) 500 MG chewable tablet Chew 1 tablet by mouth as needed for heartburn.    . carvedilol (COREG) 25 MG tablet Take 1 tablet (25 mg total) by mouth 2 (two) times daily with a meal. 240 tablet 0  . pantoprazole (PROTONIX) 40 MG tablet Take 1 tablet (40 mg total) by mouth daily. 30 tablet 1  . amLODipine (NORVASC) 10 MG tablet Take 1 tablet (10 mg total) by mouth daily. (Patient not taking: Reported on 03/21/2020) 120  tablet 0  . ferrous sulfate 324 (65 Fe) MG TBEC Take 1 tablet by mouth daily. (Patient not taking: Reported on 03/21/2020)    . ibuprofen (ADVIL,MOTRIN) 200 MG tablet Take 200 mg by mouth every 6 (six) hours as needed for pain.    Marland Kitchen lisinopril (ZESTRIL) 20 MG tablet Take 1 tablet (20 mg total) by mouth daily. (Patient not taking: Reported on 03/21/2020) 120 tablet 0   No current facility-administered medications for this visit.    Review of Systems  Constitutional:  Constitutional negative. HENT: HENT negative.  Eyes: Eyes negative.  Respiratory: Respiratory negative.  Cardiovascular: Cardiovascular negative.  GI: Gastrointestinal negative.  Musculoskeletal: Positive for leg pain.  Skin: Skin negative.  Neurological: Neurological negative. Hematologic: Hematologic/lymphatic negative.  Psychiatric: Psychiatric negative.        Objective:  Objective   Vitals:   03/21/20 0902  BP: (!) 142/91  Pulse: 65  Resp: 20  Temp: 98.4 F (36.9 C)  SpO2: 96%  Weight: 205 lb (93 kg)  Height: 6' (1.829 m)   Body mass index is 27.8 kg/m.  Physical Exam HENT:     Head: Normocephalic.     Nose:     Comments: Wearing a mask Eyes:     Pupils: Pupils are equal, round, and reactive to light.  Cardiovascular:  Pulses:          Radial pulses are 2+ on the right side and 2+ on the left side.       Femoral pulses are 2+ on the right side and 2+ on the left side. Pulmonary:     Effort: Pulmonary effort is normal.  Abdominal:     General: Abdomen is flat.     Palpations: Abdomen is soft.  Musculoskeletal:        General: No swelling. Normal range of motion.  Skin:    General: Skin is warm and dry.     Capillary Refill: Capillary refill takes less than 2 seconds.  Neurological:     Mental Status: He is alert.  Psychiatric:        Mood and Affect: Mood normal.        Behavior: Behavior normal.        Thought Content: Thought content normal.        Judgment: Judgment normal.      Data: CTA IMPRESSION: 1. Stable type B aortic dissection extending from just beyond the left subclavian origin through the length of the descending thoracic and abdominal aorta, with continued patency of true and false lumens. 2. Dissection does extend into the celiac axis resulting in short-segment origin stenosis as before, with the proper hepatic and splenic arterial branches remaining widely patent. 3. Decreased enhancement of the left kidney compared to the right which may simply be related to scan timing although in the absence of delayed scans, compromised renal perfusion could not be entirely excluded. 4. Stable 2.6 cm pancreatic tail cystic lesion, may represent pseudocyst or cystic neoplasm. Elective outpatient MR abdomen with contrast may be useful for further characterization. 5. Coronary calcifications. The severity of coronary artery disease and any potential stenosis cannot be assessed on this non-gated CT examination.      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 We reviewed his CT angio together which demonstrates some concerning findings although are chronic he has no aneurysmal degeneration. At this time it should be okay to return to work. He does need to get his antihypertensive medications filled by his primary care physician. He will follow-up in 6 months with repeat CT angio unless he has issues with back pain abdominal pain or other concerns prior.     Maeola Harman MD [facility]

## 2020-09-10 ENCOUNTER — Other Ambulatory Visit: Payer: Self-pay

## 2020-09-10 DIAGNOSIS — I71019 Dissection of thoracic aorta, unspecified: Secondary | ICD-10-CM

## 2020-09-10 DIAGNOSIS — I7101 Dissection of thoracic aorta: Secondary | ICD-10-CM

## 2020-09-18 ENCOUNTER — Ambulatory Visit (HOSPITAL_COMMUNITY): Payer: BC Managed Care – PPO | Attending: Vascular Surgery

## 2020-10-03 ENCOUNTER — Ambulatory Visit: Payer: BC Managed Care – PPO | Admitting: Vascular Surgery

## 2022-10-30 DEATH — deceased
